# Patient Record
Sex: Female | Born: 2000 | Race: Black or African American | Hispanic: No | Marital: Single | Smoking: Never smoker
Health system: Southern US, Community
[De-identification: ages and names within clinical notes are randomized; demographics above are authoritative.]

## PROBLEM LIST (undated history)

## (undated) DIAGNOSIS — F319 Bipolar disorder, unspecified: Secondary | ICD-10-CM

## (undated) DIAGNOSIS — E282 Polycystic ovarian syndrome: Secondary | ICD-10-CM

## (undated) HISTORY — DX: Bipolar disorder, unspecified: F31.9

## (undated) HISTORY — DX: Polycystic ovarian syndrome: E28.2

---
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
## 2020-05-02 ENCOUNTER — Ambulatory Visit (HOSPITAL_COMMUNITY)
Admission: EM | Admit: 2020-05-02 | Discharge: 2020-05-02 | Disposition: A | Discharge status: Home / Self Care | Payer: Medicaid Other | Attending: Family Medicine | Admitting: Family Medicine

## 2020-05-02 ENCOUNTER — Encounter (HOSPITAL_COMMUNITY): Payer: Self-pay

## 2020-05-02 ENCOUNTER — Other Ambulatory Visit: Payer: Self-pay

## 2020-05-02 DIAGNOSIS — J029 Acute pharyngitis, unspecified: Secondary | ICD-10-CM | POA: Diagnosis not present

## 2020-05-02 DIAGNOSIS — R059 Cough, unspecified: Secondary | ICD-10-CM | POA: Diagnosis not present

## 2020-05-02 LAB — POCT RAPID STREP A, ED / UC: Streptococcus, Group A Screen (Direct): NEGATIVE

## 2020-05-02 MED ORDER — HYDROCODONE-HOMATROPINE 5-1.5 MG/5ML PO SYRP: 5.0000 mL | ORAL_SOLUTION | Freq: Four times a day (QID) | ORAL | 0 refills | Status: DC | PRN

## 2020-05-02 MED ORDER — PREDNISONE 20 MG PO TABS
40.0000 mg | ORAL_TABLET | Freq: Every day | ORAL | 0 refills | Status: DC
Start: 2020-05-02 — End: 2021-12-22

## 2020-05-02 NOTE — Discharge Instructions (Signed)
Be aware, your cough medication may cause drowsiness. Please do not drive, operate heavy machinery or make important decisions while on this medication, it can cloud your judgement.  
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
## 2020-05-03 ENCOUNTER — Ambulatory Visit: Payer: Self-pay

## 2020-05-04 LAB — CULTURE, GROUP A STREP (THRC)

## 2020-05-04 NOTE — ED Provider Notes (Signed)
[facility]   [id number] 05/02/20 Arrival Time: 1213  ASSESSMENT & PLAN:  1. Sore throat   2. Cough    Rapid strep negative. Culture sent.  Declines COVID testing.   Meds ordered this encounter  Medications  . predniSONE (DELTASONE) 20 MG tablet    Sig: Take 2 tablets (40 mg total) by mouth daily.    Dispense:  10 tablet    Refill:  0  . HYDROcodone-homatropine (HYCODAN) 5-1.5 MG/5ML syrup    Sig: Take 5 mLs by mouth every 6 (six) hours as needed for cough.    Dispense:  90 mL    Refill:  0     Follow-up Information    Jovita Kussmaul, MD.   Specialty: Family Medicine Why: If worsening or failing to improve as anticipated. Contact information: [address] [place] [address] [phone]               Reviewed expectations re: course of current medical issues. Questions answered. Outlined signs and symptoms indicating need for more acute intervention. Understanding verbalized. After Visit Summary given.   SUBJECTIVE: History from: patient and caregiver. Rose Savage is a 19 y.o. female who presents with a sore throat, mild congestion, and persistent cough. Over past week. Afebrile. Normal PO intake without n/v/d. No known COVID exp. Cough is keeping her up at night.    OBJECTIVE:  Vitals:   05/02/20 1259  BP: 118/65  Pulse: 79  Resp: 18  Temp: 98 F (36.7 C)  TempSrc: Oral  SpO2: 100%    General appearance: alert; no distress Eyes: PERRLA; EOMI; conjunctiva normal HENT: [place]; AT; with nasal congestion; throat with mild erythema and cobblestoning Neck: supple  Lungs: speaks full sentences without difficulty; unlabored Extremities: no edema Skin: warm and dry Neurologic: normal gait Psychological: alert and cooperative; normal mood and affect  Labs:  Labs Reviewed  CULTURE, GROUP A STREP [facility])  POCT RAPID STREP A, ED / UC      No Known Allergies  Past Medical History:  Diagnosis Date  . Bipolar disorder (HCC)    . PCOS (polycystic ovarian syndrome)    Social History   Socioeconomic History  . Marital status: Single    Spouse name: Not on file  . Number of children: Not on file  . Years of education: Not on file  . Highest education level: Not on file  Occupational History  . Not on file  Tobacco Use  . Smoking status: Never Smoker  . Smokeless tobacco: Never Used  Substance and Sexual Activity  . Alcohol use: No    Alcohol/week: 0.0 standard drinks  . Drug use: No  . Sexual activity: Never  Other Topics Concern  . Not on file  Social History Narrative   Lives at home with Mom, 4 sisters and 1 nephew. She is in 8th grade.    Social Determinants of Health   Financial Resource Strain:   . Difficulty of Paying Living Expenses: Not on file  Food Insecurity:   . Worried About Programme researcher, broadcasting/film/video in the Last Year: Not on file  . Ran Out of Food in the Last Year: Not on file  Transportation Needs:   . Lack of Transportation (Medical): Not on file  . Lack of Transportation (Non-Medical): Not on file  Physical Activity:   . Days of Exercise per Week: Not on file  . Minutes of Exercise per Session: Not on file  Stress:   . Feeling of Stress :  Not on file  Social Connections:   . Frequency of Communication with Friends and Family: Not on file  . Frequency of Social Gatherings with Friends and Family: Not on file  . Attends Religious Services: Not on file  . Active Member of Clubs or Organizations: Not on file  . Attends Banker Meetings: Not on file  . Marital Status: Not on file  Intimate Partner Violence:   . Fear of Current or Ex-Partner: Not on file  . Emotionally Abused: Not on file  . Physically Abused: Not on file  . Sexually Abused: Not on file   Family History  Adopted: Yes   History reviewed. No pertinent surgical history.   Mardella Layman, MD 05/04/20 1014

## 2020-05-10 ENCOUNTER — Other Ambulatory Visit: Payer: Self-pay | Admitting: Family

## 2020-05-10 DIAGNOSIS — E282 Polycystic ovarian syndrome: Secondary | ICD-10-CM

## 2020-05-13 ENCOUNTER — Encounter: Payer: Self-pay | Admitting: Family Medicine

## 2020-05-13 ENCOUNTER — Other Ambulatory Visit: Payer: Self-pay

## 2020-05-13 ENCOUNTER — Ambulatory Visit (INDEPENDENT_AMBULATORY_CARE_PROVIDER_SITE_OTHER): Payer: Medicaid Other | Admitting: Family Medicine

## 2020-05-13 VITALS — BP 104/64 | HR 91 | Ht 62.8 in | Wt 171.8 lb

## 2020-05-13 DIAGNOSIS — R251 Tremor, unspecified: Secondary | ICD-10-CM

## 2020-05-13 NOTE — Progress Notes (Signed)
    SUBJECTIVE:   CHIEF COMPLAINT / HPI:   Tremor Patient presents out of concern for bilateral hand tremor.  This has been a persistent thing and she has been evaluated for this in the past.  Patient reports that it did improve over the last few months but that she woke up this morning and noticed it was significantly worse.  She had some breakfast and the tremor has improved but she still has a tremor in her right thumb as well as she feels like her face.  Reports that she is taking her medications as prescribed and she was on a steroid dose for sinus infection which she completed last Friday or Saturday.  PERTINENT  PMH / PSH: History of tremor, ADHD, bipolar  OBJECTIVE:   BP 104/64   Pulse 91   Ht 5' 2.8" (1.595 m)   Wt 171 lb 12.8 oz (77.9 kg)   LMP 04/19/2020 (Approximate)   SpO2 99%   BMI 30.63 kg/m   General: Well-appearing 19 year old female in no acute distress Cardiac: Regular rate and rhythm, no murmurs appreciated Respiratory: Normal work of breathing Neuro: Slight tremor in right thumb, finger-to-nose tests appropriate, patient does intermittently have mild face shaking, reflexes appropriate and symmetrical, strength appropriate ASSESSMENT/PLAN:   Tremor Mild tremor in right thumb noted, neuro exam is reassuring.  Most likely an extrapyramidal side effect from her medications.  Also consider electrolyte abnormalities and hyperthyroidism.  She is scheduled to follow-up with her psychiatrist in a week or 2. -Checking Depakote levels today -BMP today -TSH ordered -Follow-up with psychiatry regarding medication management   Derrel Nip, MD [facility]

## 2020-05-13 NOTE — Patient Instructions (Addendum)
It was great to see you this morning.  I am sorry you are having these issues with the tremor.  It may be related to your medications but it may also be related to you stopping the prednisone.  Other possibilities are issues with your thyroid but this is much less likely.  We are going to check your thyroid levels as well as your medication levels.  I am also going to check your blood glucose.  If it turns out that it is related to your medications you will need to follow-up with your psychiatric provider for adjustments in this.  These results will be posted to your MyChart but I can also call you with them.  If you have any questions, concerns please feel free to call the clinic.  I hope you have a wonderful afternoon!   Tremor A tremor is trembling or shaking that you cannot control. Most tremors affect the hands or arms. Tremors can also affect the head, vocal cords, face, and other parts of the body. There are many types of tremors. Common types include:  Essential tremor. These usually occur in people older than 40. It may run in families and can happen in otherwise healthy people.  Resting tremor. These occur when the muscles are at rest, such as when your hands are resting in your lap. People with Parkinson's disease often have resting tremors.  Postural tremor. These occur when you try to hold a pose, such as keeping your hands outstretched.  Kinetic tremor. These occur during purposeful movement, such as trying to touch a finger to your nose.  Task-specific tremor. These may occur when you perform certain tasks such as writing, speaking, or standing.  Psychogenic tremor. These dramatically lessen or disappear when you are distracted. They can happen in people of all ages. Some types of tremors have no known cause. Tremors can also be a symptom of nervous system problems (neurological disorders) that may occur with aging. Some tremors go away with treatment, while others do not. Follow  these instructions at home: Lifestyle      Limit alcohol intake to no more than 1 drink a day for nonpregnant women and 2 drinks a day for men. One drink equals 12 oz of beer, 5 oz of wine, or 1 oz of hard liquor.  Do not use any products that contain nicotine or tobacco, such as cigarettes and e-cigarettes. If you need help quitting, ask your health care provider.  Avoid extreme heat and extreme cold.  Limit your caffeine intake, as told by your health care provider.  Try to get 8 hours of sleep each night.  Find ways to manage your stress, such as meditation or yoga. General instructions  Take over-the-counter and prescription medicines only as told by your health care provider.  Keep all follow-up visits as told by your health care provider. This is important. Contact a health care provider if you:  Develop a tremor after starting a new medicine.  Have a tremor along with other symptoms such as: ? Numbness. ? Tingling. ? Pain. ? Weakness.  Notice that your tremor gets worse.  Notice that your tremor interferes with your day-to-day life. Summary  A tremor is trembling or shaking that you cannot control.  Most tremors affect the hands or arms.  Some types of tremors have no known cause. Others may be a symptom of nervous system problems (neurological disorders).  Make sure you discuss any tremors you have with your health care provider. This  information is not intended to replace advice given to you by your health care provider. Make sure you discuss any questions you have with your health care provider. Document Revised: 05/10/2017 Document Reviewed: 03/28/2017 Elsevier Patient Education  2020 [employer].

## 2020-05-13 NOTE — Assessment & Plan Note (Addendum)
Mild tremor in right thumb noted, neuro exam is reassuring.  Most likely an extrapyramidal side effect from her medications.  Also consider electrolyte abnormalities and hyperthyroidism.  She is scheduled to follow-up with her psychiatrist in a week or 2. -Checking Depakote levels today -BMP today -TSH ordered -Follow-up with psychiatry regarding medication management

## 2020-05-14 LAB — BASIC METABOLIC PANEL
BUN/Creatinine Ratio: 12 (ref 9–23)
BUN: 8 mg/dL (ref 6–20)
CO2: 19 mmol/L — ABNORMAL LOW (ref 20–29)
Calcium: 9.4 mg/dL (ref 8.7–10.2)
Chloride: 106 mmol/L (ref 96–106)
Creatinine, Ser: 0.67 mg/dL (ref 0.57–1.00)
GFR calc Af Amer: 147 mL/min/{1.73_m2} (ref >59)
GFR calc non Af Amer: 128 mL/min/{1.73_m2} (ref >59)
Glucose: 135 mg/dL — ABNORMAL HIGH (ref 65–99)
Potassium: 4 mmol/L (ref 3.5–5.2)
Sodium: 141 mmol/L (ref 134–144)

## 2020-05-14 LAB — TSH: TSH: 0.79 u[IU]/mL (ref 0.450–4.500)

## 2020-05-14 LAB — VALPROIC ACID LEVEL: Valproic Acid Lvl: 44 ug/mL — ABNORMAL LOW (ref 50–100)

## 2020-05-24 ENCOUNTER — Encounter: Payer: Self-pay | Admitting: Family Medicine

## 2020-05-24 NOTE — Progress Notes (Signed)
I have attempted to call the patient on multiple occasions but the patient's voicemail will not let you leave a voicemail.  Her labs are mostly normal except her valproic acid level is a little low.  This may be due to noncompliance or she may need an increased dose.  She needs to be sure to take her medications daily.  I will mail a letter with her results.

## 2020-06-21 ENCOUNTER — Other Ambulatory Visit: Payer: Self-pay

## 2020-06-21 ENCOUNTER — Emergency Department (HOSPITAL_COMMUNITY)
Admission: EM | Admit: 2020-06-21 | Discharge: 2020-06-22 | Disposition: A | Discharge status: Home / Self Care | Payer: Medicaid Other | Attending: Emergency Medicine | Admitting: Emergency Medicine

## 2020-06-21 DIAGNOSIS — F3110 Bipolar disorder, current episode manic without psychotic features, unspecified: Secondary | ICD-10-CM | POA: Diagnosis not present

## 2020-06-21 DIAGNOSIS — T50905A Adverse effect of unspecified drugs, medicaments and biological substances, initial encounter: Secondary | ICD-10-CM

## 2020-06-21 DIAGNOSIS — Z20822 Contact with and (suspected) exposure to covid-19: Secondary | ICD-10-CM | POA: Diagnosis not present

## 2020-06-21 DIAGNOSIS — R251 Tremor, unspecified: Secondary | ICD-10-CM | POA: Diagnosis present

## 2020-06-21 DIAGNOSIS — F3160 Bipolar disorder, current episode mixed, unspecified: Secondary | ICD-10-CM | POA: Diagnosis not present

## 2020-06-21 DIAGNOSIS — F3161 Bipolar disorder, current episode mixed, mild: Secondary | ICD-10-CM | POA: Insufficient documentation

## 2020-06-21 LAB — CBC
HCT: 39.2 % (ref 36.0–46.0)
Hemoglobin: 13.5 g/dL (ref 12.0–15.0)
MCH: 30.7 pg (ref 26.0–34.0)
MCHC: 34.4 g/dL (ref 30.0–36.0)
MCV: 89.1 fL (ref 80.0–100.0)
Platelets: 203 10*3/uL (ref 150–400)
RBC: 4.4 MIL/uL (ref 3.87–5.11)
RDW: 11.9 % (ref 11.5–15.5)
WBC: 7.5 10*3/uL (ref 4.0–10.5)
nRBC: 0 % (ref 0.0–0.2)

## 2020-06-21 LAB — COMPREHENSIVE METABOLIC PANEL
ALT: 22 U/L (ref 0–44)
AST: 19 U/L (ref 15–41)
Albumin: 3.6 g/dL (ref 3.5–5.0)
Alkaline Phosphatase: 39 U/L (ref 38–126)
Anion gap: 12 (ref 5–15)
BUN: 8 mg/dL (ref 6–20)
CO2: 21 mmol/L — ABNORMAL LOW (ref 22–32)
Calcium: 9.6 mg/dL (ref 8.9–10.3)
Chloride: 106 mmol/L (ref 98–111)
Creatinine, Ser: 0.64 mg/dL (ref 0.44–1.00)
GFR, Estimated: >60 mL/min (ref >60)
Glucose, Bld: 83 mg/dL (ref 70–99)
Potassium: 3.9 mmol/L (ref 3.5–5.1)
Sodium: 139 mmol/L (ref 135–145)
Total Bilirubin: 0.2 mg/dL — ABNORMAL LOW (ref 0.3–1.2)
Total Protein: 7.1 g/dL (ref 6.5–8.1)

## 2020-06-21 LAB — RAPID URINE DRUG SCREEN, HOSP PERFORMED
Amphetamines: NOT DETECTED
Barbiturates: NOT DETECTED
Benzodiazepines: NOT DETECTED
Cocaine: NOT DETECTED
Opiates: NOT DETECTED
Tetrahydrocannabinol: NOT DETECTED

## 2020-06-21 LAB — RESP PANEL BY RT-PCR (FLU A&B, COVID) ARPGX2
Influenza A by PCR: NEGATIVE
Influenza B by PCR: NEGATIVE
SARS Coronavirus 2 by RT PCR: NEGATIVE

## 2020-06-21 LAB — I-STAT BETA HCG BLOOD, ED (MC, WL, AP ONLY): I-stat hCG, quantitative: <5 m[IU]/mL (ref <5)

## 2020-06-21 MED ORDER — LORAZEPAM 1 MG PO TABS
1.0000 mg | ORAL_TABLET | Freq: Once | ORAL | Status: AC
  Administered 2020-06-21: 1 mg via ORAL
  Filled 2020-06-21: qty 1

## 2020-06-21 NOTE — ED Notes (Signed)
Pt wanded °

## 2020-06-21 NOTE — ED Triage Notes (Signed)
Pt with hx of bipolar, arrives from home BIB mother. Mother reports pt manic, crying, and slamming doors recently. Called [facility] about bringing her there and they told her to come to the ED. Pt tearful in triage.

## 2020-06-21 NOTE — ED Provider Notes (Signed)
[facility] EMERGENCY DEPARTMENT Provider Note   CSN: [id number] Arrival date & time: 06/21/20  1643     History No chief complaint on file.   Rose Savage is a 20 y.o. female.  Patient is a 20 year old female with a history of bipolar disease and PCOS who is presenting today reporting that the medication she recently started 3 weeks ago is not working well.  She does not know why they changed her medication but since being on it she feels like she is getting much worse.  She is having mood swings and today reports she has been crying all day long.  She also admits to hearing voices and more recently they have told her she would be better with another family.  She states in the past they have never sounded this harsh before.  She has been taking her medication as prescribed.  She denies any drug alcohol or cigarette use.  She does feel like she has been sleeping okay but has been in multiple arguments with her mom.  Mom is concerned that the patient is manic as she has been crying and slamming doors frequently.  They did call behavioral health urgent care and they requested that they bring her to the emergency room.  She denies any suicidal or homicidal thoughts at this time.  The history is provided by the patient.       Past Medical History:  Diagnosis Date  . Bipolar disorder (HCC)   . PCOS (polycystic ovarian syndrome)     Patient Active Problem List   Diagnosis Date Noted  . Tremor 01/27/2019  . PCOS (polycystic ovarian syndrome) 08/28/2014  . Abnormal weight gain 08/28/2014  . Acne vulgaris 08/28/2014    No past surgical history on file.   OB History   No obstetric history on file.     Family History  Adopted: Yes    Social History   Tobacco Use  . Smoking status: Never Smoker  . Smokeless tobacco: Never Used  Substance Use Topics  . Alcohol use: No    Alcohol/week: 0.0 standard drinks  . Drug use: No    Home Medications Prior to  Admission medications   Medication Sig Start Date End Date Taking? Authorizing Provider  adapalene (DIFFERIN) 0.1 % gel APPLY TOPICALLY AT BEDTIME AND WASH OFF IN THE MORNING 05/22/16   Georges Mouse, NP  [facility] MELATONIN 3 MG TABS tablet TAKE 1 TABLET (3 MG TOTAL) BY MOUTH AT BEDTIME AS NEEDED. 12/22/19   Georges Mouse, NP  divalproex (DEPAKOTE ER) 500 MG 24 hr tablet Take 500 mg by mouth 2 (two) times daily. 08/07/15   [provider]  FANAPT 2 MG TABS Take by mouth at bedtime. 03/24/20   [provider]  HYDROcodone-homatropine (HYCODAN) 5-1.5 MG/5ML syrup Take 5 mLs by mouth every 6 (six) hours as needed for cough. 05/02/20   Mardella Layman, MD  norgestimate-ethinyl estradiol (ORTHO-CYCLEN) 0.25-35 MG-MCG tablet TAKE 1 TABLET BY MOUTH EVERY DAY 05/10/20   Alfonso Ramus T, FNP  predniSONE (DELTASONE) 20 MG tablet Take 2 tablets (40 mg total) by mouth daily. 05/02/20   Mardella Layman, MD  spironolactone (ALDACTONE) 100 MG tablet TAKE 1 TABLET BY MOUTH EVERY DAY 12/21/19   Georges Mouse, NP  SPRINTEC 28 0.25-35 MG-MCG tablet TAKE 1 TABLET BY MOUTH EVERY DAY 10/08/17   Georges Mouse, NP  STRATTERA 80 MG capsule Take 80 mg by mouth daily. 06/19/15   [provider]  Allergies    Patient has no known allergies.  Review of Systems   Review of Systems  All other systems reviewed and are negative.   Physical Exam Updated Vital Signs BP 117/79 (BP Location: Right Arm)   Pulse 79   Temp 98.7 F (37.1 C) (Oral)   Resp 18   Ht 5\' 2"  (1.575 m)   Wt 78.5 kg   SpO2 100%   BMI 31.64 kg/m   Physical Exam Vitals and nursing note reviewed.  Constitutional:      General: She is not in acute distress.    Appearance: She is well-developed, normal weight and well-nourished.  HENT:     Head: Normocephalic and atraumatic.  Eyes:     Extraocular Movements: EOM normal.     Pupils: Pupils are equal, round, and reactive to light.  Cardiovascular:     Rate and  Rhythm: Normal rate and regular rhythm.     Pulses: Intact distal pulses.     Heart sounds: Normal heart sounds. No murmur heard. No friction rub.  Pulmonary:     Effort: Pulmonary effort is normal.     Breath sounds: Normal breath sounds. No wheezing or rales.  Abdominal:     General: Bowel sounds are normal. There is no distension.     Palpations: Abdomen is soft.     Tenderness: There is no abdominal tenderness. There is no guarding or rebound.  Musculoskeletal:        General: No tenderness. Normal range of motion.     Comments: No edema  Skin:    General: Skin is warm and dry.     Findings: No rash.  Neurological:     Mental Status: She is alert and oriented to person, place, and time.     Cranial Nerves: No cranial nerve deficit.  Psychiatric:        Attention and Perception: She perceives auditory hallucinations.        Mood and Affect: Mood is anxious. Affect is tearful.        Speech: Speech normal.        Behavior: Behavior is cooperative.        Thought Content: Thought content is not paranoid. Thought content does not include homicidal or suicidal ideation.     Comments: Pt holding a toy unicorn and frequently petting it and talking to it during the interview     ED Results / Procedures / Treatments   Labs (all labs ordered are listed, but only abnormal results are displayed) Labs Reviewed  COMPREHENSIVE METABOLIC PANEL - Abnormal; Notable for the following components:      Result Value   CO2 21 (*)    Total Bilirubin 0.2 (*)    All other components within normal limits  RESP PANEL BY RT-PCR (FLU A&B, COVID) ARPGX2  CBC  RAPID URINE DRUG SCREEN, HOSP PERFORMED  ETHANOL  SALICYLATE LEVEL  ACETAMINOPHEN LEVEL  I-STAT BETA HCG BLOOD, ED ([facility], [facility], [facility] ONLY)    EKG None  Radiology No results found.  Procedures Procedures (including critical care time)  Medications Ordered in ED Medications  LORazepam (ATIVAN) tablet 1 mg (has no administration in time  range)    ED Course  I have reviewed the triage vital signs and the nursing notes.  Pertinent labs & imaging results that were available during my care of the patient were reviewed by me and considered in my medical decision making (see chart for details).    MDM Rules/Calculators/A&P  Patient presenting today for mental health evaluation.  Concern for poor reaction to recent medication changes and possible manic episode.  Patient is tearful on exam and appears anxious but is cooperative.  She denies any SI or HI.  She appears medically clear at this time.  Vital signs are normal on exam other than her mental health is normal.  Patient is medically clear for evaluation.  COVID lab is pending.  hCG, CBC, CMP and urine drug screen are all negative.  Final Clinical Impression(s) / ED Diagnoses Final diagnoses:  None    Rx / DC Orders ED Discharge Orders    None       Gwyneth Sprout, MD 06/25/20 [phone]

## 2020-06-21 NOTE — BH Assessment (Signed)
Comprehensive Clinical Assessment (CCA) Note  06/21/2020 Rose Savage [id number]  Chief Complaint:  Chief Complaint  Patient presents with  . Medication Reaction   Visit Diagnosis:   Bipolar disorder, mixed  Rose Savage is a 20 yo female currently at [facility] for assessment of medication-induced symptoms related to anxiety/bipolar disorder. Pt reports that she feels her thoughts are racing, she is hearing voices (telling her negative things about herself, to hurt herself, and that she needs a new family), and increased levels of anxiety.  Pt denies SI, HI, and visual hallucinations. Pt denies any substance use at all. Pt is not expressing any paranoid ideation an is not responding to internal stimuli at time of assessment. Pt reports that her physicians at [facility] are managing current medications for her. Pt cannot remember the name of the medications other than abilify "I get an abilify shot". Pt states that her mom isn't happy with the way her medications are handled currently, and wants to make some changes. Pt reports that she does not feel she is a danger to herself at time of assessment.  Rose Savage, MSW, LCSW Outpatient Therapist/Triage Specialist   Disposition: Per Rose Abts, PA pt is to be transferred to [facility] for overnight observation and safety and will be reassessed by psychiatric provider in the AM.   CCA Screening, Triage and Referral (STR)  Patient Reported Information How did you hear about [place]? Self  Referral name: [facility] recommendation  Referral phone number: No data recorded  Whom do you see for routine medical problems? No data recorded Practice/Facility Name: No data recorded Practice/Facility Phone Number: No data recorded Name of Contact: No data recorded Contact Number: No data recorded Contact Fax Number: No data recorded Prescriber Name: No data recorded Prescriber Address (if known): No data recorded  What Is the Reason for Your  Visit/Call Today? No data recorded How Long Has This Been Causing You Problems? <Week  What Do You Feel Would Help You the Most Today? No data recorded  Have You Recently Been in Any Inpatient Treatment (Hospital/Detox/Crisis Center/28-Day Program)? No  Name/Location of Program/Hospital:No data recorded How Long Were You There? No data recorded When Were You Discharged? No data recorded  Have You Ever Received Services From [facility] Before? Yes  Who Do You See at [facility]? No data recorded  Have You Recently Had Any Thoughts About Hurting Yourself? No  Are You Planning to Commit Suicide/Harm Yourself At This time? No   Have you Recently Had Thoughts About Hurting Someone Rose Savage? No  Explanation: No data recorded  Have You Used Any Alcohol or Drugs in the Past 24 Hours? No  How Long Ago Did You Use Drugs or Alcohol? No data recorded What Did You Use and How Much? No data recorded  Do You Currently Have a Therapist/Psychiatrist? Yes  Name of Therapist/Psychiatrist: [facility]   Have You Been Recently Discharged From Any Office Practice or Programs? No  Explanation of Discharge From Practice/Program: No data recorded    CCA Screening Triage Referral Assessment Type of Contact: Tele-Assessment  Is this Initial or Reassessment? Initial Assessment  Date Telepsych consult ordered in [facility]:  No data recorded Time Telepsych consult ordered in [facility]:  No data recorded  Patient Reported Information Reviewed? Yes  Patient Left Without Being Seen? No data recorded Reason for Not Completing Assessment: No data recorded  Collateral Involvement: No data recorded  Does Patient Have a Court Appointed Legal Guardian? No data recorded Name and Contact  of Legal Guardian: No data recorded If Minor and Not Living with Parent(s), Who has Custody? No data recorded Is CPS involved or ever been involved? Never  Is APS involved or ever been involved? Never   Patient  Determined To Be At Risk for Harm To Self or Others Based on Review of Patient Reported Information or Presenting Complaint? No  Method: No data recorded Availability of Means: No data recorded Intent: No data recorded Notification Required: No data recorded Additional Information for Danger to Others Potential: No data recorded Additional Comments for Danger to Others Potential: No data recorded Are There Guns or Other Weapons in Your Home? No data recorded Types of Guns/Weapons: No data recorded Are These Weapons Safely Secured?                            No data recorded Who Could Verify You Are Able To Have These Secured: No data recorded Do You Have any Outstanding Charges, Pending Court Dates, Parole/Probation? No data recorded Contacted To Inform of Risk of Harm To Self or Others: No data recorded  Location of Assessment: [facility] ED   Does Patient Present under Involuntary Commitment? No  IVC Papers Initial File Date: No data recorded  [place] of Residence: [place]   Patient Currently Receiving the Following Services: Medication Management   Determination of Need: No data recorded  Options For Referral: [facility] (overnight observation)     CCA Biopsychosocial Intake/Chief Complaint:  No data recorded Current Symptoms/Problems: issues with medication right now--feels like she is having bad anxiety   Patient Reported Schizophrenia/Schizoaffective Diagnosis in Past: No   Strengths: graduated high school. proud of getting a job  Preferences: loves nieces and nephew  Abilities: No data recorded  Type of Services Patient Feels are Needed: medication management   Initial Clinical Notes/Concerns: anxiety levels are high   Mental Health Symptoms Depression:  Irritability; Fatigue; Difficulty Concentrating; Change in energy/activity; Sleep (too much or little)   Duration of Depressive symptoms: Greater than two weeks   Mania:  Racing thoughts (thoughts  telling me better off dead)   Anxiety:   Difficulty concentrating; Fatigue; Irritability; Restlessness   Psychosis:  Hallucinations (voices in head telling me things bad about myself and my family)   Duration of Psychotic symptoms: Less than six months   Trauma:  N/A   Obsessions:  None   Compulsions:  None   Inattention:  None   Hyperactivity/Impulsivity:  N/A   Oppositional/Defiant Behaviors:  N/A   Emotional Irregularity:  None   Other Mood/Personality Symptoms:  No data recorded   Mental Status Exam Appearance and self-care  Stature:  Average   Weight:  Average weight   Clothing:  No data recorded  Grooming:  Normal   Cosmetic use:  None   Posture/gait:  Normal   Motor activity:  Not Remarkable   Sensorium  Attention:  Normal   Concentration:  Normal   Orientation:  X5   Recall/memory:  Normal   Affect and Mood  Affect:  Appropriate   Mood:  Anxious   Relating  Eye contact:  Normal; Staring   Facial expression:  Anxious   Attitude toward examiner:  Cooperative   Thought and Language  Speech flow: Slow   Thought content:  Appropriate to Mood and Circumstances   Preoccupation:  None   Hallucinations:  Auditory (voices say negative things about pt and pts family. Sometimes tell her to hurt herself)  Organization:  No data recorded  Affiliated Computer ServicesExecutive Functions  Fund of Knowledge:  Good   Intelligence:  Average   Abstraction:  Functional   Judgement:  Fair   Reality Testing:  Adequate   Insight:  Fair   Decision Making:  Impulsive   Social Functioning  Social Maturity:  Impulsive   Social Judgement:  Naive   Stress  Stressors:  Grief/losses; Illness   Coping Ability:  Overwhelmed   Skill Deficits:  None   Supports:  Family     Religion: Religion/Spirituality Are You A Religious Person?: Yes  Leisure/Recreation: Leisure / Recreation Do You Have Hobbies?: Yes Leisure and Hobbies: beanie baby collector--loves to watch  wrestling. Love NFL football: like Runner, broadcasting/film/videoagles. Color, sing, draw, dance  Exercise/Diet: Exercise/Diet Do You Exercise?: Yes What Type of Exercise Do You Do?: Run/Walk How Many Times a Week Do You Exercise?: 1-3 times a week Do You Follow a Special Diet?: No (trying to eat healthy: limit junk food) Do You Have Any Trouble Sleeping?: Yes Explanation of Sleeping Difficulties: good nights and bad nights.  stay up all night one time.   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  Pt and mother very close. Description of patient's relationship with caregiver when they were a child: stable Patient's description of current relationship with people who raised him/her: stable Does patient have siblings?: Yes Description of patient's current relationship with siblings: one adopted brother--stable relationship with brother.  6 adopted sisters including myself. Stable relationships with sisters. Did patient suffer any verbal/emotional/physical/sexual abuse as a child?: No Did patient suffer from severe childhood neglect?: No Has patient ever been sexually abused/assaulted/raped as an adolescent or adult?: No Was the patient ever a victim of a crime or a disaster?: No Witnessed domestic violence?: No Has patient been affected by domestic violence as an adult?: No  Child/Adolescent Assessment:     CCA Substance Use Alcohol/Drug Use: Alcohol  / Drug Use Pain Medications: see MAR Prescriptions: see MAR Over the Counter: see MAR History of alcohol / drug use?: No history of alcohol / drug abuse          ASAM's:  Six Dimensions of Multidimensional Assessment  Dimension 1:  Acute Intoxication and/or Withdrawal Potential:   Dimension 1:  Description of individual's past and current experiences of substance use and withdrawal: pt denies  Dimension 2:  Biomedical Conditions and Complications:      Dimension 3:  Emotional, Behavioral, or Cognitive Conditions and Complications:     Dimension 4:  Readiness to Change:     Dimension 5:  Relapse, Continued use, or Continued Problem Potential:     Dimension 6:  Recovery/Living Environment:     ASAM Severity Score: ASAM's Severity Rating Score: 0  ASAM Recommended Level of Treatment:     Substance use Disorder (SUD)    Recommendations for Services/Supports/Treatments: Recommendations for Services/Supports/Treatments Recommendations For Services/Supports/Treatments: Medication Management (overnight observation [facility] and provider reassessment in AM)  DSM5 Diagnoses: Patient Active Problem List   Diagnosis Date Noted  . Bipolar disorder,  current episode mixed, mild (HCC)   . Tremor 01/27/2019  . PCOS (polycystic ovarian syndrome) 08/28/2014  . Abnormal weight gain 08/28/2014  . Acne vulgaris 08/28/2014    Referrals to Alternative Service(s): Referred to Alternative Service(s):   Place:   Date:   Time:    Referred to Alternative Service(s):   Place:   Date:   Time:    Referred to Alternative Service(s):   Place:   Date:   Time:    Referred to Alternative Service(s):   Place:   Date:   Time:     Ernest Haber Meredyth Hornung, LCSW

## 2020-06-21 NOTE — ED Notes (Signed)
Personal belongings inventoried and stored at locker[address]at [facility].

## 2020-06-21 NOTE — ED Notes (Signed)
TTS video interview in progress .  

## 2020-06-21 NOTE — BH Assessment (Signed)
Disposition: Per Melbourne Abts, PA pt is to be transferred to [facility] for overnight observation and safety and will be reassessed by psychiatric provider in the AM.  Weyman Pedro, MSW, LCSW Outpatient Therapist/Triage Specialist

## 2020-06-22 DIAGNOSIS — T50905A Adverse effect of unspecified drugs, medicaments and biological substances, initial encounter: Secondary | ICD-10-CM

## 2020-06-22 DIAGNOSIS — F3110 Bipolar disorder, current episode manic without psychotic features, unspecified: Secondary | ICD-10-CM

## 2020-06-22 DIAGNOSIS — R251 Tremor, unspecified: Secondary | ICD-10-CM

## 2020-06-22 NOTE — Consult Note (Signed)
Telepsych Consultation   Location of Patient: [facility] Location of Provider: [facility]  Patient Identification: Rose Savage MRN:  [id number] Principal Diagnosis: Tremor Diagnosis:  Principal Problem:   Tremor   Total Time spent with patient: 30 minutes  HPI:  Reassessment: Patient seen via telepsych. Chart reviewed. Rose Savage is a 20 year old female with history of bipolar disorder who was brought to [facility] by her mother yesterday, who reported patient had been manic, crying, and slamming doors at home.  On assessment today, patient is calm and cooperative, reporting stable mood. She states "It all started with a new medicine." She states after starting a new medication for tremors 2-3 weeks ago, she started having shakes, feeling dizzy at work, and "not like myself." She is unsure of the medication name but states she takes it at night and then has a difficult time standing out of bed in the morning because she is dizzy. When asked if the medication is Ingrezza, she states "I think that might be it." She reports crying yesterday on and off, but is unable to say why. She reports good mood prior to yesterday. She denies any SI/HI. She reports hearing voices yesterday telling her, "Go back to [place]. No one here loves you." She states this was the first time she heard voices in years. She denies any CAH to hurt herself or others in years. Denies any AVH today and shows no signs of responding to internal stimuli. No paranoid or delusional thought content expressed. She is requesting discharge home to her family so that she can return to work tomorrow.  With patient's expressed consent, collateral information from her mother Rose Savage [phone]: Rose Savage also reports patient has been "shaking like she has parkinson's," dizzy, drooling, speech changes, and fatigued since starting a new medication two weeks ago. She is unsure of the name of the medication but states  that it had to come from a specialty pharmacy. She tried calling the outpatient office but states they did not seem concerned about the side effects. I advised that the medication sounded like Ingrezza and was likely causing parkinsonian side effects. I advised patient discontinue Ingrezza and continue the remainder of her medication regimen which she has been stable on for years, and Rose Savage states understanding. I advised patient is requesting d/c home and does not meet IVC criteria at this time due to not being suicidal, homicidal or psychotic, and Rose Savage expresses agreement. She is requesting referrals for other outpatient psychiatric providers.  Per TTS assessment 06/21/20: Rose Savage is a 20 yo female currently at [facility] for assessment of medication-induced symptoms related to anxiety/bipolar disorder. Pt reports that she feels her thoughts are racing, she is hearing voices (telling her negative things about herself, to hurt herself, and that she needs a new family), and increased levels of anxiety.  Pt denies SI, HI, and visual hallucinations. Pt denies any substance use at all. Pt is not expressing any paranoid ideation an is not responding to internal stimuli at time of assessment. Pt reports that her physicians at [facility] are managing current medications for her. Pt cannot remember the name of the medications other than abilify "I get an abilify shot". Pt states that her mom isn't happy with the way her medications are handled currently, and wants to make some changes. Pt reports that she does not feel she is a danger to herself at time of assessment.  Disposition: Patient shows no evidence of acute risk of harm  to self or others and is psych cleared for discharge. CSW to provide outpatient referrals. ED staff updated.  Past Psychiatric History: See above  Risk to Self:   Risk to Others:   Prior Inpatient Therapy:   Prior Outpatient Therapy:    Past Medical History:  Past  Medical History:  Diagnosis Date  . Bipolar disorder (HCC)   . PCOS (polycystic ovarian syndrome)    No past surgical history on file. Family History:  Family History  Adopted: Yes   Family Psychiatric  History: Unknown Social History:  Social History   Substance and Sexual Activity  Alcohol Use No  . Alcohol/week: 0.0 standard drinks     Social History   Substance and Sexual Activity  Drug Use No    Social History   Socioeconomic History  . Marital status: Single    Spouse name: Not on file  . Number of children: Not on file  . Years of education: Not on file  . Highest education level: Not on file  Occupational History  . Not on file  Tobacco Use  . Smoking status: Never Smoker  . Smokeless tobacco: Never Used  Substance and Sexual Activity  . Alcohol use: No    Alcohol/week: 0.0 standard drinks  . Drug use: No  . Sexual activity: Never  Other Topics Concern  . Not on file  Social History Narrative   Lives at home with Mom, 4 sisters and 1 nephew. She is in 8th grade.    Social Determinants of Health   Financial Resource Strain: Not on file  Food Insecurity: Not on file  Transportation Needs: Not on file  Physical Activity: Not on file  Stress: Not on file  Social Connections: Not on file   Additional Social History:    Allergies:  No Known Allergies  Labs:  Results for orders placed or performed during the hospital encounter of 06/21/20 (from the past 48 hour(s))  Comprehensive metabolic panel     Status: Abnormal   Collection Time: 06/21/20  4:54 PM  Result Value Ref Range   Sodium 139 135 - 145 mmol/L   Potassium 3.9 3.5 - 5.1 mmol/L   Chloride 106 98 - 111 mmol/L   CO2 21 (L) 22 - 32 mmol/L   Glucose, Bld 83 70 - 99 mg/dL    Comment: Glucose reference range applies only to samples taken after fasting for at least 8 hours.   BUN 8 6 - 20 mg/dL   Creatinine, Ser 0.93 0.44 - 1.00 mg/dL   Calcium 9.6 8.9 - 81.8 mg/dL   Total Protein 7.1 6.5 -  8.1 g/dL   Albumin 3.6 3.5 - 5.0 g/dL   AST 19 15 - 41 U/L   ALT 22 0 - 44 U/L   Alkaline Phosphatase 39 38 - 126 U/L   Total Bilirubin 0.2 (L) 0.3 - 1.2 mg/dL   GFR, Estimated >29 >93 mL/min    Comment: (NOTE) Calculated using the CKD-EPI Creatinine Equation (2021)    Anion gap 12 5 - 15    Comment: Performed at [facility], [address] [place], [place] [address]  cbc     Status: None   Collection Time: 06/21/20  4:54 PM  Result Value Ref Range   WBC 7.5 4.0 - 10.5 K/uL   RBC 4.40 3.87 - 5.11 MIL/uL   Hemoglobin 13.5 12.0 - 15.0 g/dL   HCT 78.9 38.1 - 01.7 %   MCV 89.1 80.0 - 100.0  fL   MCH 30.7 26.0 - 34.0 pg   MCHC 34.4 30.0 - 36.0 g/dL   RDW 40.911.9 81.111.5 - 91.415.5 %   Platelets 203 150 - 400 K/uL   nRBC 0.0 0.0 - 0.2 %    Comment: Performed at [facility], [address] [place], [place] [address]  I-Stat beta hCG blood, ED     Status: None   Collection Time: 06/21/20  5:10 PM  Result Value Ref Range   I-stat hCG, quantitative <5.0 <5 mIU/mL   Comment 3            Comment:   GEST. AGE      CONC.  (mIU/mL)   <=1 WEEK        5 - 50     2 WEEKS       50 - 500     3 WEEKS       100 - 10,000     4 WEEKS     1,000 - 30,000        FEMALE AND NON-PREGNANT FEMALE:     LESS THAN 5 mIU/mL   Rapid urine drug screen (hospital performed)     Status: None   Collection Time: 06/21/20  7:55 PM  Result Value Ref Range   Opiates NONE DETECTED NONE DETECTED   Cocaine NONE DETECTED NONE DETECTED   Benzodiazepines NONE DETECTED NONE DETECTED   Amphetamines NONE DETECTED NONE DETECTED   Tetrahydrocannabinol NONE DETECTED NONE DETECTED   Barbiturates NONE DETECTED NONE DETECTED    Comment: (NOTE) DRUG SCREEN FOR MEDICAL PURPOSES ONLY.  IF CONFIRMATION IS NEEDED FOR ANY PURPOSE, NOTIFY LAB WITHIN 5 DAYS.  LOWEST DETECTABLE LIMITS FOR URINE DRUG SCREEN Drug Class                     Cutoff (ng/mL) Amphetamine and metabolites    1000 Barbiturate and metabolites     200 Benzodiazepine                 200 Tricyclics and metabolites     300 Opiates and metabolites        300 Cocaine and metabolites        300 THC                            50 Performed at [facility], [address] [place], [place] [address]   Resp Panel by RT-PCR (Flu A&B, Covid) Nasopharyngeal Swab     Status: None   Collection Time: 06/21/20  9:30 PM   Specimen: Nasopharyngeal Swab; Nasopharyngeal(NP) swabs in vial transport medium  Result Value Ref Range   SARS Coronavirus 2 by RT PCR NEGATIVE NEGATIVE    Comment: (NOTE) SARS-CoV-2 target nucleic acids are NOT DETECTED.  The SARS-CoV-2 RNA is generally detectable in upper respiratory specimens during the acute phase of infection. The lowest concentration of SARS-CoV-2 viral copies this assay can detect is 138 copies/mL. A negative result does not preclude SARS-Cov-2 infection and should not be used as the sole basis for treatment or other patient management decisions. A negative result may occur with  improper specimen collection/handling, submission of specimen other than nasopharyngeal swab, presence of viral mutation(s) within the areas targeted by this assay, and inadequate number of viral copies(<138 copies/mL). A negative result must be combined with clinical observations, patient history, and epidemiological information. The expected result is Negative.  Fact Sheet  for Patients:  BloggerCourse.com  Fact Sheet for Healthcare Providers:  SeriousBroker.it  This test is no t yet approved or cleared by the [place] FDA and  has been authorized for detection and/or diagnosis of SARS-CoV-2 by FDA under an Emergency Use Authorization (EUA). This EUA will remain  in effect (meaning this test can be used) for the duration of the COVID-19 declaration under Section 564(b)(1) of the Act, 21 U.S.C.section 360bbb-3(b)(1), unless the authorization is terminated  or  revoked sooner.       Influenza A by PCR NEGATIVE NEGATIVE   Influenza B by PCR NEGATIVE NEGATIVE    Comment: (NOTE) The Xpert Xpress SARS-CoV-2/FLU/RSV plus assay is intended as an aid in the diagnosis of influenza from Nasopharyngeal swab specimens and should not be used as a sole basis for treatment. Nasal washings and aspirates are unacceptable for Xpert Xpress SARS-CoV-2/FLU/RSV testing.  Fact Sheet for Patients: BloggerCourse.com  Fact Sheet for Healthcare Providers: SeriousBroker.it  This test is not yet approved or cleared by the [place] FDA and has been authorized for detection and/or diagnosis of SARS-CoV-2 by FDA under an Emergency Use Authorization (EUA). This EUA will remain in effect (meaning this test can be used) for the duration of the COVID-19 declaration under Section 564(b)(1) of the Act, 21 U.S.C. section 360bbb-3(b)(1), unless the authorization is terminated or revoked.  Performed at [facility], [address] [place], [place] [address]     Medications:  No current facility-administered medications for this encounter.   Current Outpatient Medications  Medication Sig Dispense Refill  . adapalene (DIFFERIN) 0.1 % gel APPLY TOPICALLY AT BEDTIME AND WASH OFF IN THE MORNING 45 g 6  . [facility] MELATONIN 3 MG TABS tablet TAKE 1 TABLET (3 MG TOTAL) BY MOUTH AT BEDTIME AS NEEDED. 60 tablet 5  . divalproex (DEPAKOTE ER) 500 MG 24 hr tablet Take 500 mg by mouth 2 (two) times daily.  2  . FANAPT 2 MG TABS Take by mouth at bedtime.    Marland Kitchen HYDROcodone-homatropine (HYCODAN) 5-1.5 MG/5ML syrup Take 5 mLs by mouth every 6 (six) hours as needed for cough. 90 mL 0  . norgestimate-ethinyl estradiol (ORTHO-CYCLEN) 0.25-35 MG-MCG tablet TAKE 1 TABLET BY MOUTH EVERY DAY 84 tablet 1  . predniSONE (DELTASONE) 20 MG tablet Take 2 tablets (40 mg total) by mouth daily. 10 tablet 0  . spironolactone (ALDACTONE) 100 MG  tablet TAKE 1 TABLET BY MOUTH EVERY DAY 90 tablet 1  . SPRINTEC 28 0.25-35 MG-MCG tablet TAKE 1 TABLET BY MOUTH EVERY DAY 28 tablet 5  . STRATTERA 80 MG capsule Take 80 mg by mouth daily.  3   Psychiatric Specialty Exam: Physical Exam  Review of Systems  Blood pressure 106/68, pulse 72, temperature (!) 97.4 F (36.3 C), temperature source Oral, resp. rate 18, height 5\' 2"  (1.575 m), weight 78.5 kg, SpO2 99 %.Body mass index is 31.64 kg/m.  General Appearance: Casual  Eye Contact:  Good  Speech:  Normal Rate  Volume:  Normal  Mood:  Euthymic  Affect:  Appropriate and Congruent  Thought Process:  Coherent and Goal Directed  Orientation:  Full (Time, Place, and Person)  Thought Content:  Logical  Suicidal Thoughts:  No  Homicidal Thoughts:  No  Memory:  Immediate;   Fair Recent;   Fair Remote;   Fair  Judgement:  Intact  Insight:  Fair  Psychomotor Activity:  Normal  Concentration:  Concentration: Fair and Attention Span: Fair  Recall:  Fair  Progress EnergyFund of Knowledge:  Fair  Language:  Good  Akathisia:  No  Handed:  Right  AIMS (if indicated):     Assets:  Communication Skills Desire for Improvement Financial Resources/Insurance Housing Resilience Social Support  ADL's:  Intact  Cognition:  WNL  Sleep:       Disposition: Patient shows no evidence of acute risk of harm to self or others and is psych cleared for discharge. CSW to provide outpatient referrals. ED staff updated.  This service was provided via telemedicine using a 2-way, interactive audio and video technology with the identified patient and this Clinical research associatewriter.  Aldean BakerJanet E Sriyan Cutting, NP 06/22/2020 11:40 AM

## 2020-06-22 NOTE — ED Notes (Signed)
In triage Breakfast ordered 

## 2020-06-22 NOTE — Discharge Instructions (Signed)
Please follow up with on of the following outpatient mental health providers:  [facility] (counseling)  [phone] [address],  [place], [place] [address]  [facility] (counseling)  [phone] [address] [place], [place] [address]  [place]  [phone]  [address],  [place], [place] [address]  [facility]  (Outpatient therapy, Intensive [place], [place]) [address] [place], [place] [address] [phone] [address] [place], [place] [address] [phone]  [facility] (Medication Management)  [phone] [address]  [place], [place] [address]  You might also want to look at- Psychologytoday.[email or web address] to search for specialized outpatient therapists that take Medicaid.  In case of crisis: please bring patient back to [facility] Emergency Department, call 911 or [facility] at [phone].

## 2020-06-22 NOTE — ED Notes (Signed)
TTS speaking with pt.  

## 2020-06-22 NOTE — Progress Notes (Signed)
Pt has been psychiatrically cleared by Marciano Sequin, NP. Outpatient mental health contact information has been placed in pt's AVS.   Wells Guiles, MSW, LCSW, LCAS Clinical Social Worker II Disposition CSW [phone]

## 2020-06-22 NOTE — ED Notes (Signed)
Pt sleeping.  Breakfast placed on bedside table.

## 2020-06-22 NOTE — ED Provider Notes (Signed)
Emergency Medicine Observation Re-evaluation Note  Rhandi Despain is a 20 y.o. female, seen on rounds today.  Pt initially presented to the ED for complaints of Medication Reaction Currently, the patient is calm and cooperative.  Physical Exam  BP 106/68 (BP Location: Right Arm)   Pulse 72   Temp (!) 97.4 F (36.3 C) (Oral)   Resp 18   Ht 5\' 2"  (1.575 m)   Wt 78.5 kg   SpO2 99%   BMI 31.64 kg/m  Physical Exam General: Comfortable Cardiac: Normal heart rate Lungs: No respiratory distress Psych: Not responding to internal stimuli  ED Course / MDM  EKG:    I have reviewed the labs performed to date as well as medications administered while in observation.  Recent changes in the last 24 hours include stable, without ongoing discomfort.  Plan  Current plan is for discharge home.  Stop Ingrezza, which may be causing some of her symptoms. Patient is not under full IVC at this time.   , MD 06/22/20 1431

## 2020-06-24 ENCOUNTER — Ambulatory Visit: Payer: Medicaid Other | Admitting: Family Medicine

## 2020-07-09 ENCOUNTER — Other Ambulatory Visit: Payer: Self-pay

## 2020-07-09 ENCOUNTER — Ambulatory Visit (HOSPITAL_COMMUNITY)
Admission: EM | Admit: 2020-07-09 | Discharge: 2020-07-09 | Disposition: A | Discharge status: Home / Self Care | Payer: Medicaid Other | Attending: Family Medicine | Admitting: Family Medicine

## 2020-07-09 ENCOUNTER — Encounter (HOSPITAL_COMMUNITY): Payer: Self-pay | Admitting: Emergency Medicine

## 2020-07-09 DIAGNOSIS — J101 Influenza due to other identified influenza virus with other respiratory manifestations: Secondary | ICD-10-CM | POA: Diagnosis not present

## 2020-07-09 DIAGNOSIS — R509 Fever, unspecified: Secondary | ICD-10-CM | POA: Insufficient documentation

## 2020-07-09 DIAGNOSIS — R059 Cough, unspecified: Secondary | ICD-10-CM | POA: Diagnosis not present

## 2020-07-09 DIAGNOSIS — J029 Acute pharyngitis, unspecified: Secondary | ICD-10-CM | POA: Diagnosis present

## 2020-07-09 DIAGNOSIS — Z20822 Contact with and (suspected) exposure to covid-19: Secondary | ICD-10-CM | POA: Diagnosis not present

## 2020-07-09 LAB — SARS CORONAVIRUS 2 (TAT 6-24 HRS): SARS Coronavirus 2: NEGATIVE

## 2020-07-09 MED ORDER — HYDROCODONE-HOMATROPINE 5-1.5 MG/5ML PO SYRP: 5.0000 mL | ORAL_SOLUTION | Freq: Four times a day (QID) | ORAL | 0 refills | Status: DC | PRN

## 2020-07-09 MED ORDER — OSELTAMIVIR PHOSPHATE 75 MG PO CAPS: 75.0000 mg | ORAL_CAPSULE | Freq: Two times a day (BID) | ORAL | 0 refills | Status: AC

## 2020-07-09 MED ORDER — ACETAMINOPHEN 325 MG PO TABS
650.0000 mg | ORAL_TABLET | Freq: Once | ORAL | Status: AC
  Administered 2020-07-09: 650 mg via ORAL

## 2020-07-09 MED ORDER — ACETAMINOPHEN 325 MG PO TABS
ORAL_TABLET | ORAL | Status: AC
  Filled 2020-07-09: qty 2

## 2020-07-09 NOTE — Discharge Instructions (Addendum)
You influenza test was POSITIVE today.  You have been tested for COVID-19 today. If your test returns positive, you will receive a phone call from [facility] regarding your results. Negative test results are not called. Both positive and negative results area always visible on MyChart. If you do not have a MyChart account, sign up instructions are provided in your discharge papers. Please do not hesitate to contact us should you have questions or concerns.

## 2020-07-09 NOTE — ED Provider Notes (Signed)
[facility]   [phone] 07/09/20 Arrival Time: 1301  ASSESSMENT & PLAN:  1. Cough   2. Fever, unspecified fever cause   3. Sore throat   4. Influenza A     Influenza A +.  COVID-19 testing sent. See letter/work note on file for self-isolation guidelines. OTC symptom care as needed.  Meds ordered this encounter  Medications  . acetaminophen (TYLENOL) tablet 650 mg  . HYDROcodone-homatropine (HYCODAN) 5-1.5 MG/5ML syrup    Sig: Take 5 mLs by mouth every 6 (six) hours as needed for cough.    Dispense:  90 mL    Refill:  0  . oseltamivir (TAMIFLU) 75 MG capsule    Sig: Take 1 capsule (75 mg total) by mouth 2 (two) times daily for 5 days.    Dispense:  10 capsule    Refill:  0     Follow-up Information    Jovita Kussmaul, MD.   Specialty: Family Medicine Why: As needed. Contact information: [address] [place] [address] [phone]               Reviewed expectations re: course of current medical issues. Questions answered. Outlined signs and symptoms indicating need for more acute intervention. Understanding verbalized. After Visit Summary given.   SUBJECTIVE: History from: patient. Rose Savage is a 20 y.o. female who presents with worries regarding COVID-19. Known COVID-19 contact: none. Recent travel: none. Reports: subj fever and chills, cough, ST, body aches; abrupt onset approx 2 d ago. Denies: difficulty breathing. Normal PO intake without n/v/d.    OBJECTIVE:  Vitals:   07/09/20 1315 07/09/20 1319  BP: 119/60   Pulse: (!) 118   Resp: (!) 22   Temp: (!) 102.7 F (39.3 C)   TempSrc: Oral   SpO2: 99%   Weight:  78.5 kg  Height:  5\' 2"  (1.575 m)    Abnormal VS noted.  General appearance: alert; no distress Eyes: PERRLA; EOMI; conjunctiva normal HENT: [place]; AT; with nasal congestion Neck: supple  Lungs: speaks full sentences without difficulty; unlabored Extremities: no edema Skin: warm and dry Neurologic: normal  gait Psychological: alert and cooperative; normal mood and affect  Labs:  Labs Reviewed  SARS CORONAVIRUS 2 (TAT 6-24 HRS)     No Known Allergies  Past Medical History:  Diagnosis Date  . Bipolar disorder (HCC)   . PCOS (polycystic ovarian syndrome)    Social History   Socioeconomic History  . Marital status: Single    Spouse name: Not on file  . Number of children: Not on file  . Years of education: Not on file  . Highest education level: Not on file  Occupational History  . Not on file  Tobacco Use  . Smoking status: Never Smoker  . Smokeless tobacco: Never Used  Substance and Sexual Activity  . Alcohol use: No    Alcohol/week: 0.0 standard drinks  . Drug use: No  . Sexual activity: Never  Other Topics Concern  . Not on file  Social History Narrative   Lives at home with Mom, 4 sisters and 1 nephew. She is in 8th grade.    Social Determinants of Health   Financial Resource Strain: Not on file  Food Insecurity: Not on file  Transportation Needs: Not on file  Physical Activity: Not on file  Stress: Not on file  Social Connections: Not on file  Intimate Partner Violence: Not on file   Family History  Adopted: Yes   History reviewed. No  pertinent surgical history.   Mardella Layman, MD 07/09/20 [phone]

## 2020-07-09 NOTE — ED Triage Notes (Signed)
Presents today with cough, sorethroat, body aches and fever x 5 days.

## 2020-07-11 LAB — POC INFLUENZA A AND B ANTIGEN (URGENT CARE ONLY)
Influenza A Ag: POSITIVE — AB
Influenza B Ag: NEGATIVE

## 2020-09-01 ENCOUNTER — Other Ambulatory Visit: Payer: Self-pay | Admitting: Pediatrics

## 2020-09-01 DIAGNOSIS — E282 Polycystic ovarian syndrome: Secondary | ICD-10-CM

## 2020-10-09 ENCOUNTER — Other Ambulatory Visit: Payer: Self-pay | Admitting: Family

## 2020-10-09 DIAGNOSIS — L700 Acne vulgaris: Secondary | ICD-10-CM

## 2020-10-30 ENCOUNTER — Other Ambulatory Visit: Payer: Self-pay | Admitting: Family

## 2020-10-30 DIAGNOSIS — L700 Acne vulgaris: Secondary | ICD-10-CM

## 2021-01-09 ENCOUNTER — Other Ambulatory Visit: Payer: Self-pay | Admitting: Family

## 2021-03-13 ENCOUNTER — Other Ambulatory Visit: Payer: Self-pay | Admitting: Family

## 2021-03-13 DIAGNOSIS — L700 Acne vulgaris: Secondary | ICD-10-CM

## 2021-04-30 ENCOUNTER — Other Ambulatory Visit: Payer: Self-pay | Admitting: Pediatrics

## 2021-04-30 DIAGNOSIS — E282 Polycystic ovarian syndrome: Secondary | ICD-10-CM

## 2021-05-11 ENCOUNTER — Other Ambulatory Visit: Payer: Self-pay | Admitting: Pediatrics

## 2021-05-11 ENCOUNTER — Telehealth: Payer: Self-pay

## 2021-05-11 DIAGNOSIS — E282 Polycystic ovarian syndrome: Secondary | ICD-10-CM

## 2021-05-11 NOTE — Telephone Encounter (Signed)
Parent called requesting refill for patient for birth control. Has not been seen since 2018. Will need OV for further refill. Please call and schedule asap.

## 2021-05-19 ENCOUNTER — Other Ambulatory Visit: Payer: Self-pay

## 2021-05-19 ENCOUNTER — Other Ambulatory Visit (HOSPITAL_COMMUNITY)
Admission: RE | Admit: 2021-05-19 | Discharge: 2021-05-19 | Disposition: A | Discharge status: Home / Self Care | Payer: Medicaid Other | Source: Ambulatory Visit | Attending: Family | Admitting: Family

## 2021-05-19 ENCOUNTER — Ambulatory Visit (INDEPENDENT_AMBULATORY_CARE_PROVIDER_SITE_OTHER): Payer: Medicaid Other | Admitting: Family

## 2021-05-19 VITALS — BP 114/82 | HR 69 | Ht 61.81 in | Wt 187.2 lb

## 2021-05-19 DIAGNOSIS — E282 Polycystic ovarian syndrome: Secondary | ICD-10-CM | POA: Diagnosis not present

## 2021-05-19 DIAGNOSIS — Z113 Encounter for screening for infections with a predominantly sexual mode of transmission: Secondary | ICD-10-CM | POA: Diagnosis not present

## 2021-05-19 DIAGNOSIS — Z3202 Encounter for pregnancy test, result negative: Secondary | ICD-10-CM | POA: Diagnosis not present

## 2021-05-19 DIAGNOSIS — L700 Acne vulgaris: Secondary | ICD-10-CM | POA: Diagnosis not present

## 2021-05-19 LAB — POCT URINE PREGNANCY: Preg Test, Ur: NEGATIVE

## 2021-05-19 MED ORDER — NORGESTIMATE-ETH ESTRADIOL 0.25-35 MG-MCG PO TABS: 1.0000 | ORAL_TABLET | Freq: Every day | ORAL | 3 refills | Status: DC

## 2021-05-19 MED ORDER — SPIRONOLACTONE 100 MG PO TABS: 100.0000 mg | ORAL_TABLET | Freq: Every day | ORAL | 1 refills | Status: DC

## 2021-05-19 NOTE — Progress Notes (Signed)
History was provided by the patient and mother.  Rose Savage is a 20 y.o. female who is here for birth control. Presents with grandmother.   PCP confirmed? Yes.    Derrel Nip, MD  HPI:   -Working at [employer] on [employer]; 2-4 weeks  -before that was at [employer] and needed some place quieter  -ran out of birth control about 2 weeks ago  -no bleeding in 4-5 months  -spironolactone still taking  -female partner, interested in condoms   Patient Active Problem List   Diagnosis Date Noted   Bipolar disorder, current episode mixed, mild (HCC)    Tremor 01/27/2019   PCOS (polycystic ovarian syndrome) 08/28/2014   Abnormal weight gain 08/28/2014   Acne vulgaris 08/28/2014    Current Outpatient Medications on File Prior to Visit  Medication Sig Dispense Refill   adapalene (DIFFERIN) 0.1 % gel APPLY TOPICALLY AT BEDTIME AND WASH OFF IN THE MORNING 45 g 6   [facility] MELATONIN 3 MG TABS tablet TAKE 1 TABLET (3 MG TOTAL) BY MOUTH AT BEDTIME AS NEEDED. 90 tablet 3   divalproex (DEPAKOTE ER) 500 MG 24 hr tablet Take 500 mg by mouth 2 (two) times daily.  2   FANAPT 2 MG TABS Take by mouth at bedtime.     HYDROcodone-homatropine (HYCODAN) 5-1.5 MG/5ML syrup Take 5 mLs by mouth every 6 (six) hours as needed for cough. 90 mL 0   norgestimate-ethinyl estradiol (ORTHO-CYCLEN) 0.25-35 MG-MCG tablet TAKE 1 TABLET BY MOUTH EVERY DAY 84 tablet 1   predniSONE (DELTASONE) 20 MG tablet Take 2 tablets (40 mg total) by mouth daily. 10 tablet 0   spironolactone (ALDACTONE) 100 MG tablet TAKE 1 TABLET BY MOUTH EVERY DAY 90 tablet 1   SPRINTEC 28 0.25-35 MG-MCG tablet TAKE 1 TABLET BY MOUTH EVERY DAY 28 tablet 5   STRATTERA 80 MG capsule Take 80 mg by mouth daily.  3   No current facility-administered medications on file prior to visit.    No Known Allergies  Physical Exam:    Vitals:   05/19/21 1037  BP: 114/82  Pulse: 69  Weight: 187 lb 3.2 oz (84.9 kg)  Height: 5' 1.81" (1.57 m)   Wt  Readings from Last 3 Encounters:  05/19/21 187 lb 3.2 oz (84.9 kg)  07/09/20 173 lb (78.5 kg) (93 %, Z= 1.44)*  06/21/20 173 lb (78.5 kg) (93 %, Z= 1.44)*   * Growth percentiles are based on CDC (Girls, 2-20 Years) data.     Growth percentile SmartLinks can only be used for patients less than 64 years old. No LMP recorded. (Menstrual status: Oral contraceptives).  Physical Exam Vitals reviewed.  Constitutional:      Appearance: Normal appearance. She is not toxic-appearing.  HENT:     Head: Normocephalic.     Mouth/Throat:     Pharynx: Oropharynx is clear.  Eyes:     General: No scleral icterus.    Extraocular Movements: Extraocular movements intact.     Pupils: Pupils are equal, round, and reactive to light.  Cardiovascular:     Rate and Rhythm: Normal rate and regular rhythm.     Heart sounds: No murmur heard. Pulmonary:     Effort: Pulmonary effort is normal.  Musculoskeletal:        General: No swelling. Normal range of motion.     Cervical back: Normal range of motion and neck supple.  Lymphadenopathy:     Cervical: No cervical adenopathy.  Skin:  General: Skin is warm and dry.     Capillary Refill: Capillary refill takes less than 2 seconds.     Findings: No rash.  Neurological:     General: No focal deficit present.     Mental Status: She is alert and oriented to person, place, and time.     Motor: No tremor.  Psychiatric:        Mood and Affect: Mood normal.     Assessment/Plan:  1. PCOS (polycystic ovarian syndrome) -PCOS symptoms reviewed with patient and GM -comorb monitoring labs due today  -will also screen for HIV/RPR, vitamin D deficiency -discussed difference in menstrual suppression with continuous cycling vs secondary amenorrhea of PCOS. Refilled COC Rx for continuous cycling; reviewed use. Return in 3 months or sooner if needed.   - CBC with Differential/Platelet - Comprehensive metabolic panel - Hemoglobin A1c - Lipid panel - VITAMIN D 25  Hydroxy (Vit-D Deficiency, Fractures) - HIV Antibody (routine testing w rflx) - RPR - norgestimate-ethinyl estradiol (ORTHO-CYCLEN) 0.25-35 MG-MCG tablet; Take 1 tablet by mouth daily.  Dispense: 84 tablet; Refill: 3  2. Acne vulgaris - spironolactone (ALDACTONE) 100 MG tablet; Take 1 tablet (100 mg total) by mouth daily.  Dispense: 90 tablet; Refill: 1  3. Routine screening for STI (sexually transmitted infection) - Urine cytology ancillary only  4. Urine pregnancy test negative - POCT urine pregnancy

## 2021-05-21 ENCOUNTER — Encounter: Payer: Self-pay | Admitting: Family

## 2021-05-22 LAB — COMPREHENSIVE METABOLIC PANEL
AG Ratio: 1.3 (calc) (ref 1.0–2.5)
ALT: 21 U/L (ref 6–29)
AST: 22 U/L (ref 10–30)
Albumin: 4.2 g/dL (ref 3.6–5.1)
Alkaline phosphatase (APISO): 65 U/L (ref 31–125)
BUN: 9 mg/dL (ref 7–25)
CO2: 22 mmol/L (ref 20–32)
Calcium: 9.5 mg/dL (ref 8.6–10.2)
Chloride: 102 mmol/L (ref 98–110)
Creat: 0.78 mg/dL (ref 0.50–0.96)
Globulin: 3.2 g/dL (calc) (ref 1.9–3.7)
Glucose, Bld: 78 mg/dL (ref 65–99)
Potassium: 4.2 mmol/L (ref 3.5–5.3)
Sodium: 136 mmol/L (ref 135–146)
Total Bilirubin: 0.4 mg/dL (ref 0.2–1.2)
Total Protein: 7.4 g/dL (ref 6.1–8.1)

## 2021-05-22 LAB — URINE CYTOLOGY ANCILLARY ONLY
Bacterial Vaginitis-Urine: NEGATIVE
Candida Urine: NEGATIVE
Chlamydia: NEGATIVE
Comment: NEGATIVE
Comment: NEGATIVE
Comment: NORMAL
Neisseria Gonorrhea: NEGATIVE
Trichomonas: NEGATIVE

## 2021-05-22 LAB — HEMOGLOBIN A1C
Hgb A1c MFr Bld: 5.3 % of total Hgb (ref <5.7)
Mean Plasma Glucose: 105 mg/dL
eAG (mmol/L): 5.8 mmol/L

## 2021-05-22 LAB — CBC WITH DIFFERENTIAL/PLATELET
Absolute Monocytes: 570 cells/uL (ref 200–950)
Basophils Absolute: 23 cells/uL (ref 0–200)
Basophils Relative: 0.4 %
Eosinophils Absolute: 23 cells/uL (ref 15–500)
Eosinophils Relative: 0.4 %
HCT: 37.5 % (ref 35.0–45.0)
Hemoglobin: 12.6 g/dL (ref 11.7–15.5)
Lymphs Abs: 1807 cells/uL (ref 850–3900)
MCH: 30.8 pg (ref 27.0–33.0)
MCHC: 33.6 g/dL (ref 32.0–36.0)
MCV: 91.7 fL (ref 80.0–100.0)
MPV: 11.5 fL (ref 7.5–12.5)
Monocytes Relative: 10 %
Neutro Abs: 3278 cells/uL (ref 1500–7800)
Neutrophils Relative %: 57.5 %
Platelets: 175 10*3/uL (ref 140–400)
RBC: 4.09 10*6/uL (ref 3.80–5.10)
RDW: 13.1 % (ref 11.0–15.0)
Total Lymphocyte: 31.7 %
WBC: 5.7 10*3/uL (ref 3.8–10.8)

## 2021-05-22 LAB — LIPID PANEL
Cholesterol: 142 mg/dL (ref <200)
HDL: 61 mg/dL (ref >=50)
LDL Cholesterol (Calc): 66 mg/dL (calc)
Non-HDL Cholesterol (Calc): 81 mg/dL (calc) (ref <130)
Total CHOL/HDL Ratio: 2.3 (calc) (ref <5.0)
Triglycerides: 69 mg/dL (ref <150)

## 2021-05-22 LAB — VITAMIN D 25 HYDROXY (VIT D DEFICIENCY, FRACTURES): Vit D, 25-Hydroxy: 52 ng/mL (ref 30–100)

## 2021-05-22 LAB — HIV ANTIBODY (ROUTINE TESTING W REFLEX): HIV 1&2 Ab, 4th Generation: NONREACTIVE

## 2021-05-22 LAB — RPR: RPR Ser Ql: NONREACTIVE

## 2021-08-18 ENCOUNTER — Ambulatory Visit (INDEPENDENT_AMBULATORY_CARE_PROVIDER_SITE_OTHER): Payer: Medicaid Other | Admitting: Family

## 2021-08-18 ENCOUNTER — Other Ambulatory Visit: Payer: Self-pay

## 2021-08-18 ENCOUNTER — Encounter: Payer: Self-pay | Admitting: Family

## 2021-08-18 VITALS — BP 104/68 | HR 74 | Ht 61.81 in | Wt 171.0 lb

## 2021-08-18 DIAGNOSIS — Z3202 Encounter for pregnancy test, result negative: Secondary | ICD-10-CM | POA: Diagnosis not present

## 2021-08-18 DIAGNOSIS — L700 Acne vulgaris: Secondary | ICD-10-CM

## 2021-08-18 DIAGNOSIS — E282 Polycystic ovarian syndrome: Secondary | ICD-10-CM | POA: Diagnosis not present

## 2021-08-18 DIAGNOSIS — J302 Other seasonal allergic rhinitis: Secondary | ICD-10-CM | POA: Diagnosis not present

## 2021-08-18 LAB — POCT URINE PREGNANCY: Preg Test, Ur: NEGATIVE

## 2021-08-18 MED ORDER — LEVOCETIRIZINE DIHYDROCHLORIDE 5 MG PO TABS: 5.0000 mg | ORAL_TABLET | Freq: Every evening | ORAL | 0 refills | Status: DC

## 2021-08-18 MED ORDER — FLUTICASONE PROPIONATE 50 MCG/ACT NA SUSP: 1.0000 | Freq: Every day | NASAL | 12 refills | Status: DC

## 2021-08-18 NOTE — Progress Notes (Signed)
History was provided by the patient. ? ?Rose Savage is a 21 y.o. female who is here for PCOS, acne vulgaris.  ? ?PCP confirmed? Yes.   ? Derrel Nip, MD ? ?Plan at last visit:  ?1. PCOS (polycystic ovarian syndrome) ?-PCOS symptoms reviewed with patient and GM ?-comorb monitoring labs due today  ?-will also screen for HIV/RPR, vitamin D deficiency ?-discussed difference in menstrual suppression with continuous cycling vs secondary amenorrhea of PCOS. Refilled COC Rx for continuous cycling; reviewed use. Return in 3 months or sooner if needed.  ?  ?- CBC with Differential/Platelet ?- Comprehensive metabolic panel ?- Hemoglobin A1c ?- Lipid panel ?- VITAMIN D 25 Hydroxy (Vit-D Deficiency, Fractures) ?- HIV Antibody (routine testing w rflx) ?- RPR ?- norgestimate-ethinyl estradiol (ORTHO-CYCLEN) 0.25-35 MG-MCG tablet; Take 1 tablet by mouth daily.  Dispense: 84 tablet; Refill: 3 ?  ?(All labs WNL)  ? ?2. Acne vulgaris ?- spironolactone (ALDACTONE) 100 MG tablet; Take 1 tablet (100 mg total) by mouth daily.  Dispense: 90 tablet; Refill: 1 ?  ?3. Routine screening for STI (sexually transmitted infection) ?- Urine cytology ancillary only ?  ?4. Urine pregnancy test negative ?- POCT urine pregnancy ? ? ?HPI:   ? ?-using store bought soap and cream for face; spironolactone  ?-no breakthrough bleeding and no period - likes that  ?-not really sleeping much at night, sleeping mostly during the day  ?-otherwise no issues  ?-birthday last month was better than before but not expected; got a birthday cake with rose on it and her mom let her cut pieces for everyone so that was nice  ?-having some issues with allergies; has taken Claritin and Zyrtec in the past but nothing now  ? ? ?Patient Active Problem List  ? Diagnosis Date Noted  ? Bipolar disorder, current episode mixed, mild (HCC)   ? Tremor 01/27/2019  ? PCOS (polycystic ovarian syndrome) 08/28/2014  ? Abnormal weight gain 08/28/2014  ? Acne vulgaris 08/28/2014   ? ? ?Current Outpatient Medications on File Prior to Visit  ?Medication Sig Dispense Refill  ? adapalene (DIFFERIN) 0.1 % gel APPLY TOPICALLY AT BEDTIME AND WASH OFF IN THE MORNING 45 g 6  ? [facility] MELATONIN 3 MG TABS tablet TAKE 1 TABLET (3 MG TOTAL) BY MOUTH AT BEDTIME AS NEEDED. 90 tablet 3  ? divalproex (DEPAKOTE ER) 500 MG 24 hr tablet Take 500 mg by mouth 2 (two) times daily.  2  ? FANAPT 2 MG TABS Take by mouth at bedtime.    ? HYDROcodone-homatropine (HYCODAN) 5-1.5 MG/5ML syrup Take 5 mLs by mouth every 6 (six) hours as needed for cough. 90 mL 0  ? norgestimate-ethinyl estradiol (ORTHO-CYCLEN) 0.25-35 MG-MCG tablet Take 1 tablet by mouth daily. 84 tablet 3  ? predniSONE (DELTASONE) 20 MG tablet Take 2 tablets (40 mg total) by mouth daily. 10 tablet 0  ? spironolactone (ALDACTONE) 100 MG tablet Take 1 tablet (100 mg total) by mouth daily. 90 tablet 1  ? STRATTERA 80 MG capsule Take 80 mg by mouth daily.  3  ? ?No current facility-administered medications on file prior to visit.  ? ? ?No Known Allergies ? ?Physical Exam:  ?  ?Vitals:  ? 08/18/21 1101  ?BP: 104/68  ?Pulse: 74  ?Weight: 171 lb (77.6 kg)  ?Height: 5' 1.81" (1.57 m)  ? ?Wt Readings from Last 3 Encounters:  ?08/18/21 171 lb (77.6 kg)  ?05/19/21 187 lb 3.2 oz (84.9 kg)  ?07/09/20 173 lb (78.5 kg) (93 %, Z=  1.44)*  ? ?* Growth percentiles are based on CDC (Girls, 2-20 Years) data.  ? ? ?Growth percentile SmartLinks can only be used for patients less than 27 years old. ?No LMP recorded. (Menstrual status: Oral contraceptives). ? ?Physical Exam ?Constitutional:   ?   General: She is not in acute distress. ?   Appearance: She is well-developed.  ?HENT:  ?   Head: Normocephalic and atraumatic.  ?   Nose: Congestion present.  ?   Mouth/Throat:  ?   Pharynx: Oropharynx is clear.  ?Eyes:  ?   General: No scleral icterus. ?   Pupils: Pupils are equal, round, and reactive to light.  ?Neck:  ?   Thyroid: No thyromegaly.  ?Cardiovascular:  ?   Rate and Rhythm:  Normal rate and regular rhythm.  ?   Heart sounds: Normal heart sounds. No murmur heard. ?Pulmonary:  ?   Effort: Pulmonary effort is normal.  ?   Breath sounds: Normal breath sounds.  ?Abdominal:  ?   Palpations: Abdomen is soft.  ?Musculoskeletal:     ?   General: Normal range of motion.  ?   Cervical back: Normal range of motion and neck supple.  ?Lymphadenopathy:  ?   Cervical: No cervical adenopathy.  ?Skin: ?   General: Skin is warm and dry.  ?   Findings: No rash.  ?Neurological:  ?   Mental Status: She is alert and oriented to person, place, and time.  ?   Cranial Nerves: No cranial nerve deficit.  ?Psychiatric:     ?   Behavior: Behavior normal.     ?   Thought Content: Thought content normal.     ?   Judgment: Judgment normal.  ?  ?PHQ-SADS Last 3 Score only 08/18/2021 05/13/2020 01/23/2019  ?PHQ-15 Score 2 - -  ?Total GAD-7 Score 7 - -  ?PHQ Adolescent Score 6 4 0  ? ? ?Assessment/Plan: ?1. PCOS (polycystic ovarian syndrome) ?-continue with continuous cycling, no BTB  ?-return in 3 months  ?-labs were WNL at last visit, recheck annually or PRN  ? ?2. Acne vulgaris ?-continue with spironolactone  ? ?3. Seasonal allergies ?-reviewed xyzal and flonase use  ?-return as needed  ? ? ? ?

## 2021-11-14 ENCOUNTER — Other Ambulatory Visit: Payer: Self-pay | Admitting: Family

## 2021-11-17 ENCOUNTER — Other Ambulatory Visit: Payer: Self-pay | Admitting: Family

## 2021-11-17 ENCOUNTER — Ambulatory Visit (INDEPENDENT_AMBULATORY_CARE_PROVIDER_SITE_OTHER): Payer: Medicaid Other | Admitting: Family

## 2021-11-17 ENCOUNTER — Telehealth: Payer: Self-pay | Admitting: Family

## 2021-11-17 ENCOUNTER — Encounter: Payer: Self-pay | Admitting: Family

## 2021-11-17 VITALS — BP 116/71 | HR 81 | Ht 62.0 in | Wt 170.6 lb

## 2021-11-17 DIAGNOSIS — E559 Vitamin D deficiency, unspecified: Secondary | ICD-10-CM | POA: Diagnosis not present

## 2021-11-17 DIAGNOSIS — E282 Polycystic ovarian syndrome: Secondary | ICD-10-CM

## 2021-11-17 MED ORDER — NORGESTIMATE-ETH ESTRADIOL 0.25-35 MG-MCG PO TABS: 1.0000 | ORAL_TABLET | Freq: Every day | ORAL | 3 refills | Status: DC

## 2021-11-17 NOTE — Telephone Encounter (Signed)
Rose Savage needs a refill on her birth control can be sent to the pharmacy

## 2021-11-17 NOTE — Progress Notes (Signed)
History was provided by the patient.  Rose Savage is a 21 y.o. female who is here for PCOS follow-up.   PCP confirmed? Yes.    Derrel Nip, MD  Plan from last visit:  Assessment/Plan: 1. PCOS (polycystic ovarian syndrome) -continue with continuous cycling, no BTB  -return in 3 months  -labs were WNL at last visit, recheck annually or PRN    2. Acne vulgaris -continue with spironolactone    3. Seasonal allergies -reviewed xyzal and flonase use  -return as needed     HPI:   -has not been taking any meds since past 3 months  -was living with sister because of mom and issues with siblings; sent her to sister's house to live  -moved back in with mom about 3 weeks ago  -for some reason birth control gives her a weird feeling  -had some spotting about 3-4 days ago for one day; no period since birth control  -not sexually active  -no refills needed    Patient Active Problem List   Diagnosis Date Noted   Bipolar disorder, current episode mixed, mild (HCC)    Tremor 01/27/2019   PCOS (polycystic ovarian syndrome) 08/28/2014   Abnormal weight gain 08/28/2014   Acne vulgaris 08/28/2014    Current Outpatient Medications on File Prior to Visit  Medication Sig Dispense Refill   [facility] MELATONIN 3 MG TABS tablet TAKE 1 TABLET (3 MG TOTAL) BY MOUTH AT BEDTIME AS NEEDED. 90 tablet 3   divalproex (DEPAKOTE ER) 500 MG 24 hr tablet Take 500 mg by mouth 2 (two) times daily.  2   FANAPT 2 MG TABS Take by mouth at bedtime.     fluticasone (FLONASE) 50 MCG/ACT nasal spray Place 1 spray into both nostrils daily. 16 g 12   levocetirizine (XYZAL) 5 MG tablet TAKE 1 TABLET BY MOUTH EVERY DAY IN THE EVENING 90 tablet 0   norgestimate-ethinyl estradiol (ORTHO-CYCLEN) 0.25-35 MG-MCG tablet Take 1 tablet by mouth daily. 84 tablet 3   spironolactone (ALDACTONE) 100 MG tablet Take 1 tablet (100 mg total) by mouth daily. 90 tablet 1   adapalene (DIFFERIN) 0.1 % gel APPLY TOPICALLY AT BEDTIME  AND WASH OFF IN THE MORNING (Patient not taking: Reported on 08/18/2021) 45 g 6   HYDROcodone-homatropine (HYCODAN) 5-1.5 MG/5ML syrup Take 5 mLs by mouth every 6 (six) hours as needed for cough. (Patient not taking: Reported on 08/18/2021) 90 mL 0   predniSONE (DELTASONE) 20 MG tablet Take 2 tablets (40 mg total) by mouth daily. (Patient not taking: Reported on 08/18/2021) 10 tablet 0   STRATTERA 80 MG capsule Take 80 mg by mouth daily.  3   No current facility-administered medications on file prior to visit.    No Known Allergies  Physical Exam:    Vitals:   11/17/21 1018  BP: 116/71  Pulse: 81  Weight: 170 lb 9.6 oz (77.4 kg)  Height: 5\' 2"  (1.575 m)    Growth %ile SmartLinks can only be used for patients less than 59 years old. No LMP recorded. (Menstrual status: Oral contraceptives).  Physical Exam Constitutional:      General: She is not in acute distress.    Appearance: She is well-developed.  HENT:     Head: Normocephalic and atraumatic.  Eyes:     General: No scleral icterus.    Pupils: Pupils are equal, round, and reactive to light.  Neck:     Thyroid: No thyromegaly.  Cardiovascular:     Rate and  Rhythm: Normal rate and regular rhythm.     Heart sounds: Normal heart sounds. No murmur heard. Pulmonary:     Effort: Pulmonary effort is normal.     Breath sounds: Normal breath sounds.  Abdominal:     Palpations: Abdomen is soft.  Musculoskeletal:        General: Normal range of motion.     Cervical back: Normal range of motion and neck supple.  Lymphadenopathy:     Cervical: No cervical adenopathy.  Skin:    General: Skin is warm and dry.     Findings: No rash.  Neurological:     Mental Status: She is alert and oriented to person, place, and time.     Cranial Nerves: No cranial nerve deficit.  Psychiatric:        Behavior: Behavior normal.        Thought Content: Thought content normal.        Judgment: Judgment normal.      Assessment/Plan: 1. PCOS  (polycystic ovarian syndrome) -co-morbidity labs today, refill for OCPs; return in 3 months or sooner if needed -discussed endometrial hyperplasia as risk associated with amenorrhea 2/2 PCOS - CBC with Differential/Platelet - Comprehensive metabolic panel - Hemoglobin A1c - Lipid panel  2. Vitamin D deficiency - VITAMIN D 25 Hydroxy (Vit-D Deficiency, Fractures)

## 2021-11-18 LAB — COMPREHENSIVE METABOLIC PANEL
AG Ratio: 1.4 (calc) (ref 1.0–2.5)
ALT: 15 U/L (ref 6–29)
AST: 15 U/L (ref 10–30)
Albumin: 3.8 g/dL (ref 3.6–5.1)
Alkaline phosphatase (APISO): 36 U/L (ref 31–125)
BUN/Creatinine Ratio: 7 (calc) (ref 6–22)
BUN: 5 mg/dL — ABNORMAL LOW (ref 7–25)
CO2: 20 mmol/L (ref 20–32)
Calcium: 8.5 mg/dL — ABNORMAL LOW (ref 8.6–10.2)
Chloride: 104 mmol/L (ref 98–110)
Creat: 0.69 mg/dL (ref 0.50–0.96)
Globulin: 2.7 g/dL (calc) (ref 1.9–3.7)
Glucose, Bld: 99 mg/dL (ref 65–99)
Potassium: 3.7 mmol/L (ref 3.5–5.3)
Sodium: 134 mmol/L — ABNORMAL LOW (ref 135–146)
Total Bilirubin: 0.3 mg/dL (ref 0.2–1.2)
Total Protein: 6.5 g/dL (ref 6.1–8.1)

## 2021-11-18 LAB — CBC WITH DIFFERENTIAL/PLATELET
Absolute Monocytes: 365 cells/uL (ref 200–950)
Basophils Absolute: 21 cells/uL (ref 0–200)
Basophils Relative: 0.5 %
Eosinophils Absolute: 8 cells/uL — ABNORMAL LOW (ref 15–500)
Eosinophils Relative: 0.2 %
HCT: 38.5 % (ref 35.0–45.0)
Hemoglobin: 13.1 g/dL (ref 11.7–15.5)
Lymphs Abs: 1638 cells/uL (ref 850–3900)
MCH: 30.9 pg (ref 27.0–33.0)
MCHC: 34 g/dL (ref 32.0–36.0)
MCV: 90.8 fL (ref 80.0–100.0)
MPV: 12 fL (ref 7.5–12.5)
Monocytes Relative: 8.7 %
Neutro Abs: 2167 cells/uL (ref 1500–7800)
Neutrophils Relative %: 51.6 %
Platelets: 144 10*3/uL (ref 140–400)
RBC: 4.24 10*6/uL (ref 3.80–5.10)
RDW: 12.9 % (ref 11.0–15.0)
Total Lymphocyte: 39 %
WBC: 4.2 10*3/uL (ref 3.8–10.8)

## 2021-11-18 LAB — LIPID PANEL
Cholesterol: 117 mg/dL (ref <200)
HDL: 56 mg/dL (ref >=50)
LDL Cholesterol (Calc): 43 mg/dL (calc)
Non-HDL Cholesterol (Calc): 61 mg/dL (calc) (ref <130)
Total CHOL/HDL Ratio: 2.1 (calc) (ref <5.0)
Triglycerides: 93 mg/dL (ref <150)

## 2021-11-18 LAB — HEMOGLOBIN A1C
Hgb A1c MFr Bld: 5.1 % of total Hgb (ref <5.7)
Mean Plasma Glucose: 100 mg/dL
eAG (mmol/L): 5.5 mmol/L

## 2021-11-18 LAB — VITAMIN D 25 HYDROXY (VIT D DEFICIENCY, FRACTURES): Vit D, 25-Hydroxy: 56 ng/mL (ref 30–100)

## 2021-11-26 ENCOUNTER — Encounter: Payer: Self-pay | Admitting: Family

## 2021-12-22 ENCOUNTER — Ambulatory Visit (INDEPENDENT_AMBULATORY_CARE_PROVIDER_SITE_OTHER): Payer: Medicaid Other | Admitting: Family

## 2021-12-22 ENCOUNTER — Encounter: Payer: Self-pay | Admitting: Family

## 2021-12-22 VITALS — BP 110/68 | HR 76 | Ht 61.81 in | Wt 169.1 lb

## 2021-12-22 DIAGNOSIS — Z3202 Encounter for pregnancy test, result negative: Secondary | ICD-10-CM | POA: Diagnosis not present

## 2021-12-22 DIAGNOSIS — L680 Hirsutism: Secondary | ICD-10-CM | POA: Diagnosis not present

## 2021-12-22 DIAGNOSIS — L700 Acne vulgaris: Secondary | ICD-10-CM | POA: Diagnosis not present

## 2021-12-22 DIAGNOSIS — E282 Polycystic ovarian syndrome: Secondary | ICD-10-CM | POA: Diagnosis not present

## 2021-12-22 LAB — POCT URINE PREGNANCY: Preg Test, Ur: NEGATIVE

## 2021-12-22 MED ORDER — NORGESTIMATE-ETH ESTRADIOL 0.25-35 MG-MCG PO TABS: 1.0000 | ORAL_TABLET | Freq: Every day | ORAL | 3 refills | Status: DC

## 2021-12-22 MED ORDER — SPIRONOLACTONE 100 MG PO TABS: 100.0000 mg | ORAL_TABLET | Freq: Every day | ORAL | 1 refills | Status: DC

## 2021-12-22 NOTE — Progress Notes (Signed)
History was provided by the patient and grandmother.  Rose Savage is a 21 y.o. female who is here for PCOS, acne, hirsutism.   PCP confirmed? Yes.    Evette Georges, MD  Plan from last visit:  Assessment/Plan 11/17/21: 1. PCOS (polycystic ovarian syndrome) -co-morbidity labs today, refill for OCPs; return in 3 months or sooner if needed -discussed endometrial hyperplasia as risk associated with amenorrhea 2/2 PCOS - CBC with Differential/Platelet - Comprehensive metabolic panel - Hemoglobin A1c - Lipid panel   2. Vitamin D deficiency - VITAMIN D 25 Hydroxy (Vit-D Deficiency, Fractures)     HPI:   -checking up on new birth control  -no spotting or cramping, needs refill  -spironolactone is helping reduce hair growth on face -acne is improving   Patient Active Problem List   Diagnosis Date Noted   Bipolar disorder, current episode mixed, mild (HCC)    Tremor 01/27/2019   PCOS (polycystic ovarian syndrome) 08/28/2014   Abnormal weight gain 08/28/2014   Acne vulgaris 08/28/2014    Current Outpatient Medications on File Prior to Visit  Medication Sig Dispense Refill   [facility] MELATONIN 3 MG TABS tablet TAKE 1 TABLET (3 MG TOTAL) BY MOUTH AT BEDTIME AS NEEDED. 90 tablet 3   divalproex (DEPAKOTE ER) 500 MG 24 hr tablet Take 500 mg by mouth 2 (two) times daily.  2   FANAPT 2 MG TABS Take by mouth at bedtime.     fluticasone (FLONASE) 50 MCG/ACT nasal spray Place 1 spray into both nostrils daily. 16 g 12   levocetirizine (XYZAL) 5 MG tablet TAKE 1 TABLET BY MOUTH EVERY DAY IN THE EVENING 90 tablet 0   norgestimate-ethinyl estradiol (ORTHO-CYCLEN) 0.25-35 MG-MCG tablet Take 1 tablet by mouth daily. 84 tablet 3   spironolactone (ALDACTONE) 100 MG tablet Take 1 tablet (100 mg total) by mouth daily. 90 tablet 1   STRATTERA 80 MG capsule Take 80 mg by mouth daily.  3   amantadine (SYMMETREL) 100 MG capsule Take 100 mg by mouth 2 (two) times daily.     No current  facility-administered medications on file prior to visit.    No Known Allergies  Physical Exam:    Vitals:   12/22/21 1042  BP: 110/68  Pulse: 76  Weight: 169 lb 2 oz (76.7 kg)  Height: 5' 1.81" (1.57 m)    Growth %ile SmartLinks can only be used for patients less than 50 years old. No LMP recorded. (Menstrual status: Oral contraceptives).  Physical Exam Constitutional:      General: She is not in acute distress.    Appearance: She is well-developed.  HENT:     Head: Normocephalic and atraumatic.  Eyes:     General: No scleral icterus.    Pupils: Pupils are equal, round, and reactive to light.  Neck:     Thyroid: No thyromegaly.  Cardiovascular:     Rate and Rhythm: Normal rate and regular rhythm.     Heart sounds: Normal heart sounds. No murmur heard. Pulmonary:     Effort: Pulmonary effort is normal.     Breath sounds: Normal breath sounds.  Abdominal:     Palpations: Abdomen is soft.  Musculoskeletal:        General: Normal range of motion.     Cervical back: Normal range of motion and neck supple.  Lymphadenopathy:     Cervical: No cervical adenopathy.  Skin:    General: Skin is warm and dry.     Findings: No rash.  Comments: Facial acne clearing No facial hair present today   Neurological:     Mental Status: She is alert and oriented to person, place, and time.     Cranial Nerves: No cranial nerve deficit.  Psychiatric:        Behavior: Behavior normal.        Thought Content: Thought content normal.        Judgment: Judgment normal.      Assessment/Plan:  -doing well on current regimen; continue Ortho-Cyclen, sprinolactone -return in 3 months or sooner if needed   1. PCOS (polycystic ovarian syndrome) 2. Hirsutism 3. Acne vulgaris - norgestimate-ethinyl estradiol (ORTHO-CYCLEN) 0.25-35 MG-MCG tablet; Take 1 tablet by mouth daily.  Dispense: 84 tablet; Refill: 3 - spironolactone (ALDACTONE) 100 MG tablet; Take 1 tablet (100 mg total) by mouth  daily.  Dispense: 90 tablet; Refill: 1 4. Negative pregnancy test - POCT urine pregnancy

## 2022-03-23 ENCOUNTER — Ambulatory Visit: Payer: Medicaid Other | Admitting: Family

## 2022-04-10 ENCOUNTER — Ambulatory Visit: Payer: Medicaid Other | Admitting: Family

## 2022-04-13 ENCOUNTER — Encounter: Payer: Self-pay | Admitting: Family

## 2022-04-13 ENCOUNTER — Ambulatory Visit (INDEPENDENT_AMBULATORY_CARE_PROVIDER_SITE_OTHER): Payer: Medicaid Other | Admitting: Family

## 2022-04-13 VITALS — BP 116/67 | HR 77 | Ht 61.81 in | Wt 162.2 lb

## 2022-04-13 DIAGNOSIS — E282 Polycystic ovarian syndrome: Secondary | ICD-10-CM

## 2022-04-13 DIAGNOSIS — L700 Acne vulgaris: Secondary | ICD-10-CM | POA: Diagnosis not present

## 2022-04-13 DIAGNOSIS — L680 Hirsutism: Secondary | ICD-10-CM

## 2022-04-13 NOTE — Progress Notes (Unsigned)
History was provided by the {[id number]}.  Rose Savage is a 21 y.o. female who is here for ***.   PCP confirmed? {yes [id number]}  Jacelyn Grip, MD  Plan from last visit:  Assessment/Plan:   -doing well on current regimen; continue Ortho-Cyclen, sprinolactone -return in 3 months or sooner if needed    1. PCOS (polycystic ovarian syndrome) 2. Hirsutism 3. Acne vulgaris - norgestimate-ethinyl estradiol (ORTHO-CYCLEN) 0.25-35 MG-MCG tablet; Take 1 tablet by mouth daily.  Dispense: 84 tablet; Refill: 3 - spironolactone (ALDACTONE) 100 MG tablet; Take 1 tablet (100 mg total) by mouth daily.  Dispense: 90 tablet; Refill: 1 4. Negative pregnancy test - POCT urine pregnancy   HPI:   -no issues with birth control, no cramping  -no headaches, no vision changes  -no nausea, no vomiting  -safe to self  -not sexually active   Patient Active Problem List   Diagnosis Date Noted   Bipolar disorder, current episode mixed, mild (HCC)    Tremor 01/27/2019   PCOS (polycystic ovarian syndrome) 08/28/2014   Abnormal weight gain 08/28/2014   Acne vulgaris 08/28/2014    Current Outpatient Medications on File Prior to Visit  Medication Sig Dispense Refill   amantadine (SYMMETREL) 100 MG capsule Take 100 mg by mouth 2 (two) times daily.     [facility] MELATONIN 3 MG TABS tablet TAKE 1 TABLET (3 MG TOTAL) BY MOUTH AT BEDTIME AS NEEDED. 90 tablet 3   divalproex (DEPAKOTE ER) 500 MG 24 hr tablet Take 500 mg by mouth 2 (two) times daily.  2   FANAPT 2 MG TABS Take by mouth at bedtime.     fluticasone (FLONASE) 50 MCG/ACT nasal spray Place 1 spray into both nostrils daily. 16 g 12   levocetirizine (XYZAL) 5 MG tablet TAKE 1 TABLET BY MOUTH EVERY DAY IN THE EVENING 90 tablet 0   norgestimate-ethinyl estradiol (ORTHO-CYCLEN) 0.25-35 MG-MCG tablet Take 1 tablet by mouth daily. 84 tablet 3   spironolactone (ALDACTONE) 100 MG tablet Take 1 tablet (100 mg total) by mouth daily. 90 tablet 1   STRATTERA  80 MG capsule Take 80 mg by mouth daily.  3   No current facility-administered medications on file prior to visit.    No Known Allergies  Physical Exam:    Vitals:   04/13/22 0905  BP: 116/67  Pulse: 77  Weight: 162 lb 3.2 oz (73.6 kg)  Height: 5' 1.81" (1.57 m)    Growth %ile SmartLinks can only be used for patients less than 30 years old. No LMP recorded. (Menstrual status: Oral contraceptives).  Physical Exam   Assessment/Plan: ***

## 2022-04-14 ENCOUNTER — Encounter: Payer: Self-pay | Admitting: Family

## 2022-04-25 ENCOUNTER — Other Ambulatory Visit: Payer: Self-pay | Admitting: Family

## 2022-04-25 DIAGNOSIS — L700 Acne vulgaris: Secondary | ICD-10-CM

## 2022-07-21 ENCOUNTER — Other Ambulatory Visit: Payer: Self-pay | Admitting: Family

## 2022-07-23 ENCOUNTER — Ambulatory Visit: Payer: Medicaid Other | Admitting: Family

## 2022-08-05 ENCOUNTER — Other Ambulatory Visit: Payer: Self-pay | Admitting: Family

## 2022-08-05 DIAGNOSIS — E282 Polycystic ovarian syndrome: Secondary | ICD-10-CM

## 2022-08-10 ENCOUNTER — Ambulatory Visit: Payer: Medicaid Other | Admitting: Family

## 2022-09-03 ENCOUNTER — Ambulatory Visit (HOSPITAL_COMMUNITY)
Admission: EM | Admit: 2022-09-03 | Discharge: 2022-09-03 | Disposition: A | Discharge status: Home / Self Care | Payer: Medicaid Other | Attending: Internal Medicine | Admitting: Internal Medicine

## 2022-09-03 ENCOUNTER — Encounter (HOSPITAL_COMMUNITY): Payer: Self-pay

## 2022-09-03 DIAGNOSIS — J029 Acute pharyngitis, unspecified: Secondary | ICD-10-CM | POA: Diagnosis not present

## 2022-09-03 DIAGNOSIS — R051 Acute cough: Secondary | ICD-10-CM | POA: Diagnosis not present

## 2022-09-03 LAB — POCT RAPID STREP A, ED / UC: Streptococcus, Group A Screen (Direct): NEGATIVE

## 2022-09-03 NOTE — Discharge Instructions (Signed)
Your strep testing of your throat in the clinic is negative. Throat culture has been sent to the lab to further check for bacteria to the back of your throat, staff will call you if this is positive in the next 2-3 days.   Use the following medicines to help with symptoms: - Ibuprofen 600mg and/or Tylenol 1,000mg every 6 hours with food as needed for aches/pains or fever/chills.  - 1 tablespoon of honey in warm water and/or salt water gargles may also help with symptoms.   If you develop any new or worsening symptoms, please return.  If your symptoms are severe, please go to the emergency room.  Follow-up with your primary care provider for further evaluation and management of your symptoms as well as ongoing wellness visits.  I hope you feel better!   

## 2022-09-03 NOTE — ED Provider Notes (Signed)
[place]    CSN: [id number] Arrival date & time: 09/03/22  1040      History   Chief Complaint Chief Complaint  Patient presents with   Sore Throat   Cough    HPI Rose Savage is a 22 y.o. female.   Patient presents to urgent care for evaluation of sore throat and dry cough that started last night.  No recent known sick contacts with similar symptoms.  History of seasonal allergic rhinitis, started taking her allergy medications yesterday without much relief.  No recent antibiotic or steroid use.  Denies history of chronic respiratory problems.  Non-smoker, denies drug use.  No nausea, vomiting, diarrhea, abdominal pain, rash, headache, ear pain, dizziness, chest pain, shortness of breath, heart palpitations, or weakness.  She has not taken any Tylenol or ibuprofen in attempt to help with pain.   Sore Throat  Cough   Past Medical History:  Diagnosis Date   Bipolar disorder ([place])    PCOS (polycystic ovarian syndrome)     Patient Active Problem List   Diagnosis Date Noted   Bipolar disorder, current episode mixed, mild ([place])    Tremor 01/27/2019   PCOS (polycystic ovarian syndrome) 08/28/2014   Abnormal weight gain 08/28/2014   Acne vulgaris 08/28/2014    History reviewed. No pertinent surgical history.  OB History   No obstetric history on file.      Home Medications    Prior to Admission medications   Medication Sig Start Date End Date Taking? Authorizing Provider  amantadine (SYMMETREL) 100 MG capsule Take 100 mg by mouth 2 (two) times daily. 10/13/21   [provider]  [facility] MELATONIN 3 MG TABS tablet TAKE 1 TABLET (3 MG TOTAL) BY MOUTH AT BEDTIME AS NEEDED. 01/09/21   Trude Mcburney, FNP  divalproex (DEPAKOTE ER) 500 MG 24 hr tablet Take 500 mg by mouth 2 (two) times daily. 08/07/15   [provider]  FANAPT 2 MG TABS Take by mouth at bedtime. 03/24/20   [provider]  fluticasone (FLONASE) 50 MCG/ACT nasal  spray Place 1 spray into both nostrils daily. 08/18/21   Parthenia Ames, NP  levocetirizine (XYZAL) 5 MG tablet TAKE 1 TABLET BY MOUTH EVERY DAY IN THE EVENING 08/06/22   Parthenia Ames, NP  norgestimate-ethinyl estradiol (ORTHO-CYCLEN) 0.25-35 MG-MCG tablet TAKE 1 TABLET BY MOUTH EVERY DAY 08/06/22   Parthenia Ames, NP  spironolactone (ALDACTONE) 100 MG tablet TAKE 1 TABLET BY MOUTH EVERY DAY 04/25/22   Parthenia Ames, NP  STRATTERA 80 MG capsule Take 80 mg by mouth daily. 06/19/15   [provider]    Family History Family History  Adopted: Yes  Problem Relation Age of Onset   Healthy Mother     Social History Social History   Tobacco Use   Smoking status: Never    Passive exposure: Never   Smokeless tobacco: Never  Substance Use Topics   Alcohol use: No    Alcohol/week: 0.0 standard drinks of alcohol   Drug use: No     Allergies   Patient has no known allergies.   Review of Systems Review of Systems  Respiratory:  Positive for cough.   Per HPI   Physical Exam Triage Vital Signs ED Triage Vitals  Enc Vitals Group     BP 09/03/22 1248 112/77     Pulse Rate 09/03/22 1248 87     Resp 09/03/22 1248 18     Temp 09/03/22 1248 98.2  F (36.8 C)     Temp Source 09/03/22 1248 Oral     SpO2 09/03/22 1248 98 %     Weight --      Height --      Head Circumference --      Peak Flow --      Pain Score 09/03/22 1249 2     Pain Loc --      Pain Edu? --      Excl. in [place]? --    No data found.  Updated Vital Signs BP 112/77 (BP Location: Left Arm)   Pulse 87   Temp 98.2 F (36.8 C) (Oral)   Resp 18   SpO2 98%   Visual Acuity Right Eye Distance:   Left Eye Distance:   Bilateral Distance:    Right Eye Near:   Left Eye Near:    Bilateral Near:     Physical Exam Vitals and nursing note reviewed.  Constitutional:      Appearance: She is not ill-appearing or toxic-appearing.  HENT:     Head: Normocephalic and atraumatic.     Right Ear: Hearing,  tympanic membrane, ear canal and external ear normal.     Left Ear: Hearing, tympanic membrane, ear canal and external ear normal.     Nose: Nose normal.     Mouth/Throat:     Lips: Pink.     Mouth: Mucous membranes are moist. No injury.     Tongue: No lesions. Tongue does not deviate from midline.     Palate: No mass and lesions.     Pharynx: Oropharynx is clear. Uvula midline. Posterior oropharyngeal erythema present. No pharyngeal swelling, oropharyngeal exudate or uvula swelling.     Tonsils: No tonsillar exudate or tonsillar abscesses.  Eyes:     General: Lids are normal. Vision grossly intact. Gaze aligned appropriately.     Extraocular Movements: Extraocular movements intact.     Conjunctiva/sclera: Conjunctivae normal.  Cardiovascular:     Rate and Rhythm: Normal rate and regular rhythm.     Heart sounds: Normal heart sounds, S1 normal and S2 normal.  Pulmonary:     Effort: Pulmonary effort is normal. No respiratory distress.     Breath sounds: Normal breath sounds and air entry.  Musculoskeletal:     Cervical back: Neck supple.  Skin:    General: Skin is warm and dry.     Capillary Refill: Capillary refill takes less than 2 seconds.     Findings: No rash.  Neurological:     General: No focal deficit present.     Mental Status: She is alert and oriented to person, place, and time. Mental status is at baseline.     Cranial Nerves: No dysarthria or facial asymmetry.  Psychiatric:        Mood and Affect: Mood normal.        Speech: Speech normal.        Behavior: Behavior normal.        Thought Content: Thought content normal.        Judgment: Judgment normal.      UC Treatments / Results  Labs (all labs ordered are listed, but only abnormal results are displayed) Labs Reviewed  CULTURE, GROUP A STREP [facility])  POCT RAPID STREP A, ED / UC    EKG   Radiology No results found.  Procedures Procedures (including critical care time)  Medications Ordered in  UC Medications - No data to display  Initial Impression / Assessment  and Plan / UC Course  I have reviewed the triage vital signs and the nursing notes.  Pertinent labs & imaging results that were available during my care of the patient were reviewed by me and considered in my medical decision making (see chart for details).  1.  Viral pharyngitis, acute cough Viral pharyngitis Group A strep testing in clinic is negative, throat culture is pending. Will call patient with positive results and treat based on throat culture if necessary. Deferred monospot testing today due to timing of illness and clinical presentation. Low suspicion for peritonsillar abscess, HEENT exam is stable and without red flag signs. Patient to continue using over the counter medications for symptomatic relief. May use salt water gargles and honey in warm water/warm tea for further symptomatic relief.   Discussed physical exam and available lab work findings in clinic with patient.  Counseled patient regarding appropriate use of medications and potential side effects for all medications recommended or prescribed today. Discussed red flag signs and symptoms of worsening condition,when to call the PCP office, return to urgent care, and when to seek higher level of care in the emergency department. Patient verbalizes understanding and agreement with plan. All questions answered. Patient discharged in stable condition.   Final Clinical Impressions(s) / UC Diagnoses   Final diagnoses:  Viral pharyngitis  Acute cough     Discharge Instructions      Your strep testing of your throat in the clinic is negative. Throat culture has been sent to the lab to further check for bacteria to the back of your throat, staff will call you if this is positive in the next 2-3 days.   Use the following medicines to help with symptoms: - Ibuprofen 600mg  and/or Tylenol 1,000mg  every 6 hours with food as needed for aches/pains or  fever/chills.  - 1 tablespoon of honey in warm water and/or salt water gargles may also help with symptoms.   If you develop any new or worsening symptoms, please return.  If your symptoms are severe, please go to the emergency room.  Follow-up with your primary care provider for further evaluation and management of your symptoms as well as ongoing wellness visits.  I hope you feel better!    ED Prescriptions   None    PDMP not reviewed this encounter.   Talbot Grumbling, Enoch 09/03/22 1357

## 2022-09-03 NOTE — ED Triage Notes (Signed)
Pt c/o sore throat, cough, and swollen tonsils with white patches since last night. Denies taking any meds.

## 2022-09-04 ENCOUNTER — Ambulatory Visit (INDEPENDENT_AMBULATORY_CARE_PROVIDER_SITE_OTHER): Payer: Medicaid Other | Admitting: Family

## 2022-09-04 ENCOUNTER — Encounter: Payer: Self-pay | Admitting: Family

## 2022-09-04 VITALS — BP 112/72 | HR 77 | Ht 62.0 in | Wt 171.4 lb

## 2022-09-04 DIAGNOSIS — N898 Other specified noninflammatory disorders of vagina: Secondary | ICD-10-CM | POA: Diagnosis not present

## 2022-09-04 DIAGNOSIS — L700 Acne vulgaris: Secondary | ICD-10-CM | POA: Diagnosis not present

## 2022-09-04 DIAGNOSIS — E282 Polycystic ovarian syndrome: Secondary | ICD-10-CM

## 2022-09-04 DIAGNOSIS — L680 Hirsutism: Secondary | ICD-10-CM

## 2022-09-04 DIAGNOSIS — E559 Vitamin D deficiency, unspecified: Secondary | ICD-10-CM

## 2022-09-04 NOTE — Progress Notes (Signed)
History was provided by the patient.  Rose Savage is a 22 y.o. female who is here for PCOS, hirsutism, acne.   PCP confirmed? Yes.    Jacelyn Grip, MD  Plan from last visit:  1. PCOS (polycystic ovarian syndrome) 2. Hirsutism 3. Acne vulgaris -symptoms managed well with COC and spironolactone use  -return in 3 months  -comorb labs at next visit  -assessed mood today; stable with no concerns at present    HPI:   -staying up late watching wrestling - favorites are [employer] and Baldo Ash Liliane [employer] Flare's daughter)  -fishy smell discharge- mostly white or blood tinge  -has chores at home, not working outside of the home yet  -going to bathroom a lot - peeing a lot; both night and day  -no pain or burning, no hesitancy, empties bladder  -drinking water with medicine; juice, soda, tea -  -water 2 full cups  -soda/tea - 1/2 cup  -more for juice  -sometimes skips meals  -feels hungry at night when she skips dinner  -snacks during day  -discussed referral for ongoing care due to age; she is agreeable to referral  -psych meds managed elsewhere; safe to self; feels better when on meds; tremor better; forgot meds this weekend when she went to her sister's but restarted them and feeling better now   Patient Active Problem List   Diagnosis Date Noted   Bipolar disorder, current episode mixed, mild ([place])    Tremor 01/27/2019   PCOS (polycystic ovarian syndrome) 08/28/2014   Abnormal weight gain 08/28/2014   Acne vulgaris 08/28/2014    Current Outpatient Medications on File Prior to Visit  Medication Sig Dispense Refill   amantadine (SYMMETREL) 100 MG capsule Take 100 mg by mouth 2 (two) times daily.     [facility] MELATONIN 3 MG TABS tablet TAKE 1 TABLET (3 MG TOTAL) BY MOUTH AT BEDTIME AS NEEDED. 90 tablet 3   divalproex (DEPAKOTE ER) 500 MG 24 hr tablet Take 500 mg by mouth 2 (two) times daily.  2   FANAPT 2 MG TABS Take by mouth at bedtime.     fluticasone (FLONASE) 50 MCG/ACT  nasal spray Place 1 spray into both nostrils daily. 16 g 12   levocetirizine (XYZAL) 5 MG tablet TAKE 1 TABLET BY MOUTH EVERY DAY IN THE EVENING 90 tablet 0   norgestimate-ethinyl estradiol (ORTHO-CYCLEN) 0.25-35 MG-MCG tablet TAKE 1 TABLET BY MOUTH EVERY DAY 84 tablet 3   spironolactone (ALDACTONE) 100 MG tablet TAKE 1 TABLET BY MOUTH EVERY DAY 90 tablet 1   STRATTERA 80 MG capsule Take 80 mg by mouth daily.  3   No current facility-administered medications on file prior to visit.    No Known Allergies  Physical Exam:    Vitals:   09/04/22 0914  BP: 112/72  Pulse: 77  Weight: 171 lb 6.4 oz (77.7 kg)  Height: 5\' 2"  (1.575 m)   Wt Readings from Last 3 Encounters:  09/04/22 171 lb 6.4 oz (77.7 kg)  04/13/22 162 lb 3.2 oz (73.6 kg)  12/22/21 169 lb 2 oz (76.7 kg)     Growth %ile SmartLinks can only be used for patients less than 68 years old. No LMP recorded. (Menstrual status: Oral contraceptives).  Physical Exam Constitutional:      General: She is not in acute distress.    Appearance: She is well-developed.  HENT:     Head: Normocephalic and atraumatic.  Eyes:     General: No scleral icterus.  Pupils: Pupils are equal, round, and reactive to light.  Neck:     Thyroid: No thyromegaly.  Cardiovascular:     Rate and Rhythm: Normal rate and regular rhythm.     Heart sounds: Normal heart sounds. No murmur heard. Pulmonary:     Effort: Pulmonary effort is normal.     Breath sounds: Normal breath sounds.  Musculoskeletal:        General: Normal range of motion.     Cervical back: Normal range of motion and neck supple.  Lymphadenopathy:     Cervical: No cervical adenopathy.  Skin:    General: Skin is warm and dry.     Findings: No rash.  Neurological:     Mental Status: She is alert and oriented to person, place, and time.     Cranial Nerves: No cranial nerve deficit.     Motor: No tremor.  Psychiatric:        Behavior: Behavior normal.        Thought Content:  Thought content normal.        Judgment: Judgment normal.      Assessment/Plan:  -likely BV, will screen today  -will assess A1C  -increase water intake, decrease sugary drinks  -sleep hygiene issues 2/2 to lack of structure in day  -referral to OB/GYN to establish care; continue with OCP and spironolactone  -PCOS comorb labs today   1. Vaginal discharge - WET PREP BY MOLECULAR PROBE  2. PCOS (polycystic ovarian syndrome) - CBC with Differential/Platelet - Comprehensive metabolic panel - Hemoglobin A1c - Lipid panel  3. Hirsutism 4. Acne vulgaris 5. Vitamin D deficiency - VITAMIN D 25 Hydroxy (Vit-D Deficiency, Fractures)

## 2022-09-04 NOTE — Patient Instructions (Signed)
Return in 3 months  I will reach out with lab results from today!   Keep taking birth control pills and the spironolactone.

## 2022-09-05 LAB — HEMOGLOBIN A1C
Hgb A1c MFr Bld: 5.2 % of total Hgb (ref <5.7)
Mean Plasma Glucose: 103 mg/dL
eAG (mmol/L): 5.7 mmol/L

## 2022-09-05 LAB — CULTURE, GROUP A STREP (THRC)

## 2022-09-05 LAB — COMPREHENSIVE METABOLIC PANEL
AG Ratio: 1.2 (calc) (ref 1.0–2.5)
ALT: 18 U/L (ref 6–29)
AST: 17 U/L (ref 10–30)
Albumin: 3.8 g/dL (ref 3.6–5.1)
Alkaline phosphatase (APISO): 48 U/L (ref 31–125)
BUN: 7 mg/dL (ref 7–25)
CO2: 23 mmol/L (ref 20–32)
Calcium: 8.9 mg/dL (ref 8.6–10.2)
Chloride: 102 mmol/L (ref 98–110)
Creat: 0.63 mg/dL (ref 0.50–0.96)
Globulin: 3.2 g/dL (calc) (ref 1.9–3.7)
Glucose, Bld: 75 mg/dL (ref 65–99)
Potassium: 3.9 mmol/L (ref 3.5–5.3)
Sodium: 136 mmol/L (ref 135–146)
Total Bilirubin: 0.2 mg/dL (ref 0.2–1.2)
Total Protein: 7 g/dL (ref 6.1–8.1)

## 2022-09-05 LAB — CBC WITH DIFFERENTIAL/PLATELET
Absolute Monocytes: 706 cells/uL (ref 200–950)
Basophils Absolute: 9 cells/uL (ref 0–200)
Basophils Relative: 0.1 %
Eosinophils Absolute: 17 cells/uL (ref 15–500)
Eosinophils Relative: 0.2 %
HCT: 38.5 % (ref 35.0–45.0)
Hemoglobin: 12.9 g/dL (ref 11.7–15.5)
Lymphs Abs: 1649 cells/uL (ref 850–3900)
MCH: 30.6 pg (ref 27.0–33.0)
MCHC: 33.5 g/dL (ref 32.0–36.0)
MCV: 91.2 fL (ref 80.0–100.0)
MPV: 11.6 fL (ref 7.5–12.5)
Monocytes Relative: 8.3 %
Neutro Abs: 6120 cells/uL (ref 1500–7800)
Neutrophils Relative %: 72 %
Platelets: 145 10*3/uL (ref 140–400)
RBC: 4.22 10*6/uL (ref 3.80–5.10)
RDW: 12.4 % (ref 11.0–15.0)
Total Lymphocyte: 19.4 %
WBC: 8.5 10*3/uL (ref 3.8–10.8)

## 2022-09-05 LAB — VITAMIN D 25 HYDROXY (VIT D DEFICIENCY, FRACTURES): Vit D, 25-Hydroxy: 33 ng/mL (ref 30–100)

## 2022-09-05 LAB — WET PREP BY MOLECULAR PROBE
Candida species: NOT DETECTED
Gardnerella vaginalis: NOT DETECTED
MICRO NUMBER:: 14743948
SPECIMEN QUALITY:: ADEQUATE
Trichomonas vaginosis: NOT DETECTED

## 2022-09-05 LAB — LIPID PANEL
Cholesterol: 116 mg/dL (ref <200)
HDL: 65 mg/dL (ref >=50)
LDL Cholesterol (Calc): 32 mg/dL (calc)
Non-HDL Cholesterol (Calc): 51 mg/dL (calc) (ref <130)
Total CHOL/HDL Ratio: 1.8 (calc) (ref <5.0)
Triglycerides: 102 mg/dL (ref <150)

## 2022-12-29 ENCOUNTER — Other Ambulatory Visit: Payer: Self-pay | Admitting: Family

## 2022-12-29 DIAGNOSIS — L700 Acne vulgaris: Secondary | ICD-10-CM

## 2023-01-19 ENCOUNTER — Other Ambulatory Visit: Payer: Self-pay | Admitting: Family

## 2023-01-19 DIAGNOSIS — L700 Acne vulgaris: Secondary | ICD-10-CM

## 2023-04-09 ENCOUNTER — Other Ambulatory Visit: Payer: Self-pay | Admitting: Family

## 2023-04-19 ENCOUNTER — Ambulatory Visit (INDEPENDENT_AMBULATORY_CARE_PROVIDER_SITE_OTHER): Payer: MEDICAID | Admitting: Student

## 2023-04-19 ENCOUNTER — Encounter: Payer: Self-pay | Admitting: Student

## 2023-04-19 VITALS — BP 118/64 | HR 76 | Ht 62.0 in | Wt 170.8 lb

## 2023-04-19 DIAGNOSIS — R350 Frequency of micturition: Secondary | ICD-10-CM | POA: Diagnosis not present

## 2023-04-19 LAB — POCT URINALYSIS DIP (MANUAL ENTRY)
Bilirubin, UA: NEGATIVE
Blood, UA: NEGATIVE
Glucose, UA: NEGATIVE mg/dL
Ketones, POC UA: NEGATIVE mg/dL
Leukocytes, UA: NEGATIVE
Nitrite, UA: NEGATIVE
Protein Ur, POC: NEGATIVE mg/dL
Spec Grav, UA: 1.01 (ref 1.010–1.025)
Urobilinogen, UA: 0.2 U/dL
pH, UA: 7 (ref 5.0–8.0)

## 2023-04-19 LAB — POCT UA - MICROSCOPIC ONLY
RBC, Urine, Miroscopic: NONE SEEN (ref 0–2)
WBC, Ur, HPF, POC: NONE SEEN (ref 0–5)

## 2023-04-19 LAB — POCT GLYCOSYLATED HEMOGLOBIN (HGB A1C): Hemoglobin A1C: 5 % (ref 4.0–5.6)

## 2023-04-19 NOTE — Patient Instructions (Addendum)
It was great to see you! Thank you for allowing me to participate in your care!  Our plans for today:  - Increased Urinary Frequency (Peeing too often) We are checking some lab work, to see if there is something causing you to urinate often.   If the lab work is completely normal, you may be drinking too much water, and need to cut back to see if this plays a role. Drink half as much water and only drink water when you are thirsty  This also may be coming from your Spironolactone. You may want to hold this medicine for 4-5 days to see if this is playing a role.  - Pap Smear You are due for your first Pap Smear. This is to screen for cervical cancer. Please make an appointment to come in for this.   We are checking some labs today, I will call you if they are abnormal will send you a MyChart message or a letter if they are normal.  If you do not hear about your labs in the next 2 weeks please let us know.  Take care and seek immediate care sooner if you develop any concerns.   Dr. Bess Kinds, MD [facility]

## 2023-04-19 NOTE — Assessment & Plan Note (Addendum)
Patient comes in with complaint of increased urinary frequency for the last month.  Patient appreciates she drinks 3-1/2 large travel sized bottles of water, twice a day.  Patient appreciates she urinates mostly in the mornings when she wakes up, in the evenings before bed, and in the afternoons after lunch.  Patient has numerous reasons for possible increased urinary frequency, will test for UTI, diabetes, electrolyte abnormality. Patient also on medication for bipolar disorder, Depakote, taking spironolactone for PCOS, and also drinking large volume of water daily.  If labs normal will recommend cut daily water intake by half, or consider discontinuing spironolactone. - UA - A1c - BMP - Consider decreased water by half - Consider D/c Spyro

## 2023-04-19 NOTE — Progress Notes (Signed)
  SUBJECTIVE:   CHIEF COMPLAINT / HPI:   Frequent Urination For the past month she is having frequent urination. Even if not drinking fluids.  No pain with urination. Has no urinary hesitancy. Drinks a lot of water during the day/evening. Mostly drinking water, and has to pee in the morning when she wakes, in between lunch, and after dinner. Drinks 3.5 large travel sized water containers of water BID.   PAP? Due for Pap  PERTINENT  PMH / PSH:   Past Medical History:  Diagnosis Date   Bipolar disorder ([facility])    PCOS (polycystic ovarian syndrome)    OBJECTIVE:  There were no vitals taken for this visit. Physical Exam Constitutional:      General: She is not in acute distress.    Appearance: Normal appearance. She is not ill-appearing.  Abdominal:     General: There is no distension.     Palpations: Abdomen is soft. There is no mass.     Tenderness: There is no abdominal tenderness. There is no right CVA tenderness or left CVA tenderness.     Hernia: No hernia is present.  Neurological:     Mental Status: She is alert.  Psychiatric:        Mood and Affect: Mood normal.        Behavior: Behavior normal.      ASSESSMENT/PLAN:   Assessment & Plan Urinary frequency Patient comes in with complaint of increased urinary frequency for the last month.  Patient appreciates she drinks 3-1/2 large travel sized bottles of water, twice a day.  Patient appreciates she urinates mostly in the mornings when she wakes up, in the evenings before bed, and in the afternoons after lunch.  Patient has numerous reasons for possible increased urinary frequency, will test for UTI, diabetes, electrolyte abnormality. Patient also on medication for bipolar disorder, Depakote, taking spironolactone for PCOS, and also drinking large volume of water daily.  If labs normal will recommend cut daily water intake by half, or consider discontinuing spironolactone. - UA - A1c - BMP - Consider decreased water by  half - Consider D/c Spyro No follow-ups on file. Bess Kinds, MD 04/19/2023, 7:58 AM PGY-3, [facility]

## 2023-04-20 LAB — BASIC METABOLIC PANEL
BUN/Creatinine Ratio: 5 — ABNORMAL LOW (ref 9–23)
BUN: 3 mg/dL — ABNORMAL LOW (ref 6–20)
CO2: 24 mmol/L (ref 20–29)
Calcium: 9.5 mg/dL (ref 8.7–10.2)
Chloride: 97 mmol/L (ref 96–106)
Creatinine, Ser: 0.63 mg/dL (ref 0.57–1.00)
Glucose: 96 mg/dL (ref 70–99)
Potassium: 4 mmol/L (ref 3.5–5.2)
Sodium: 133 mmol/L — ABNORMAL LOW (ref 134–144)
eGFR: 129 mL/min/{1.73_m2} (ref >59)

## 2023-04-25 ENCOUNTER — Encounter: Payer: Self-pay | Admitting: Student

## 2023-05-16 ENCOUNTER — Ambulatory Visit: Payer: MEDICAID | Admitting: Family Medicine

## 2023-07-15 ENCOUNTER — Other Ambulatory Visit: Payer: Self-pay | Admitting: Family

## 2023-07-15 DIAGNOSIS — L700 Acne vulgaris: Secondary | ICD-10-CM

## 2023-07-21 ENCOUNTER — Other Ambulatory Visit: Payer: Self-pay | Admitting: Family

## 2023-07-21 DIAGNOSIS — E282 Polycystic ovarian syndrome: Secondary | ICD-10-CM

## 2023-07-22 ENCOUNTER — Other Ambulatory Visit: Payer: Self-pay | Admitting: Family

## 2023-07-22 DIAGNOSIS — L700 Acne vulgaris: Secondary | ICD-10-CM

## 2023-08-25 ENCOUNTER — Other Ambulatory Visit: Payer: Self-pay | Admitting: Family

## 2023-08-25 DIAGNOSIS — L700 Acne vulgaris: Secondary | ICD-10-CM

## 2023-08-25 DIAGNOSIS — E282 Polycystic ovarian syndrome: Secondary | ICD-10-CM

## 2023-12-05 ENCOUNTER — Ambulatory Visit: Payer: MEDICAID | Admitting: Family Medicine

## 2024-01-23 ENCOUNTER — Ambulatory Visit (INDEPENDENT_AMBULATORY_CARE_PROVIDER_SITE_OTHER): Admitting: Family Medicine

## 2024-01-23 VITALS — BP 115/75 | HR 85 | Ht 62.0 in | Wt 172.6 lb

## 2024-01-23 DIAGNOSIS — L700 Acne vulgaris: Secondary | ICD-10-CM

## 2024-01-23 DIAGNOSIS — Z0000 Encounter for general adult medical examination without abnormal findings: Secondary | ICD-10-CM

## 2024-01-23 DIAGNOSIS — R251 Tremor, unspecified: Secondary | ICD-10-CM | POA: Diagnosis present

## 2024-01-23 NOTE — Patient Instructions (Addendum)
 I have sent in a referral to neurology to further assess your tremor.  For your acne, I recommend using a daily salicylic acid or benzoyl peroxide  cleanser with Adapalene  gel.

## 2024-01-23 NOTE — Assessment & Plan Note (Signed)
 Mild-moderate and recurrent. Prefer to hold off on spironolactone  again for now until we try skin cleansing. Recommended salicylic acid or benzoyl peroxide  cleansers daily along with use of Adapalene  gel. She will return if not improving.

## 2024-01-23 NOTE — Assessment & Plan Note (Signed)
 Most likely secondary to antipsychotic use for bipolar 1 disorder. TSH and BMP normal when symptoms first occurred. Rarer causes such as Wilson disease less likely though do remain on differential. Will refer to neurology at this time for further assessment and workup.

## 2024-01-23 NOTE — Progress Notes (Addendum)
    SUBJECTIVE:   CHIEF COMPLAINT / HPI:   Hands shaking Previously had noted tremor in right thumb in 2021, felt to be due to medications for bipolar disorder like depakote. TSH and BMP at that time reassuring. Worsening again since last 2 months. Worse with drinking water. Switch amantadine to some other medication from psychiatrist to an unknown medication, though this has not helped the shaking.  Acne Does not use cleansers. Used to be on spironolactone , but she is no longer taking this.  PERTINENT  PMH / PSH: PCOS, bipolar disorder, tremor  OBJECTIVE:   BP 115/75   Pulse 85   Ht 5' 2 (1.575 m)   Wt 172 lb 9.6 oz (78.3 kg)   SpO2 100%   BMI 31.57 kg/m   General: Alert and oriented, in NAD Skin: Warm, dry, and intact; multiple comedones over the face HEENT: NCAT, EOM grossly normal, midline nasal septum Respiratory: Breathing and speaking comfortably on RA Extremities: Moves all extremities grossly equally Neurological: Low frequency tremor noted in bilateral hands with intention; however, when examining both hands while holding a phone in one of them, the hand without the phone would tremor at rest while the hand holding the phone would remain still; strength and sensation in bilateral upper extremities normal Psychiatric: Appropriate mood and affect   ASSESSMENT/PLAN:   Assessment & Plan Tremor Most likely secondary to antipsychotic use for bipolar 1 disorder. TSH and BMP normal when symptoms first occurred. Rarer causes such as Wilson disease less likely though do remain on differential. Will refer to neurology at this time for further assessment and workup. Acne vulgaris Mild-moderate and recurrent. Prefer to hold off on spironolactone  again for now until we try skin cleansing. Recommended salicylic acid or benzoyl peroxide  cleansers daily along with use of Adapalene  gel. She will return if not improving. Healthcare maintenance She will return in 1 month for care  gaps.   Stuart Redo, MD [facility]

## 2024-01-31 ENCOUNTER — Telehealth: Payer: Self-pay

## 2024-01-31 NOTE — Telephone Encounter (Signed)
 Attempted to call patient per Dr. Albin request to inform her that the neurology referral was denied and she will need to call to speak to her psychiatry first about medication change to access if her tremor will improve.  LVM for patient to call office back.  If patient calls back please relay the message above from Dr. Tharon.  Harlene Reiter, CMA

## 2024-02-14 ENCOUNTER — Ambulatory Visit: Admitting: Family Medicine

## 2024-02-21 ENCOUNTER — Telehealth: Payer: Self-pay | Admitting: Family Medicine

## 2024-02-21 ENCOUNTER — Encounter: Payer: Self-pay | Admitting: Family Medicine

## 2024-02-21 ENCOUNTER — Ambulatory Visit (INDEPENDENT_AMBULATORY_CARE_PROVIDER_SITE_OTHER): Admitting: Family Medicine

## 2024-02-21 VITALS — BP 144/132 | HR 86 | Ht 62.0 in | Wt 173.8 lb

## 2024-02-21 DIAGNOSIS — R079 Chest pain, unspecified: Secondary | ICD-10-CM | POA: Diagnosis present

## 2024-02-21 DIAGNOSIS — R251 Tremor, unspecified: Secondary | ICD-10-CM | POA: Diagnosis not present

## 2024-02-21 NOTE — Assessment & Plan Note (Signed)
 Appears stable. Recommend she discuss medications with Psychiatry. She is in agreement.

## 2024-02-21 NOTE — Progress Notes (Signed)
    SUBJECTIVE:   CHIEF COMPLAINT / HPI:   Chest pain Developed recently, in the last couple of weeks. Comes and goes.  At night it gives me problems - woke me up last night. Feels like stabbing or getting punched in the chest. Lasts for a couple of minutes and then goes away. If she repositions and lies on her back or her side sometimes that helps. When chest pain occurs she feels like she cannot breathe; denies N/V, dizziness, feeling sweaty.  Tremors Hands are still shaking, but not worsening. No other symptoms or changes. She was seen for this 8/14 and referred to Neurology who recommended discussion with her Psychiatrist about medications she is on which may be contributing. She has not been able to speak with her Psychiatrist yet.  PERTINENT  PMH / PSH: Reviewed.  OBJECTIVE:   BP (!) 144/132   Pulse 86   Ht 5' 2 (1.575 m)   Wt 173 lb 12.8 oz (78.8 kg)   SpO2 99%   BMI 31.79 kg/m   General: well-appearing, no acute distress. HEENT: normocephalic, PERRLA, EOM grossly intact, MMM. Cardio: Regular rate, regular rhythm, no murmurs on exam. Pulm: Clear, no wheezing, no crackles. No increased work of breathing. Abdominal: bowel sounds present, soft, non-tender, non-distended. No HSM. Extremities: no peripheral edema. Moves all extremities equally. Neuro: Alert and oriented x3, speech normal in content. Psych:  Cognition and judgment appear intact. Alert, communicative, and cooperative.   ASSESSMENT/PLAN:   Assessment & Plan Chest pain, unspecified type EKG obtained in clinic is reassuring. Given that symptoms typically occur at night when lying down I suspect GERD could be contributing. Patient is not sure if she wants to try rx medication for this at this time. - recommended trial of Prilosec OTC - return precautions given Tremor Appears stable. Recommend she discuss medications with Psychiatry. She is in agreement.   Lauraine Norse, DO [facility]

## 2024-02-21 NOTE — Patient Instructions (Addendum)
 It was so good to see you today! Thank you for allowing me to take care of you.  Today we discussed the following concerns and plans:  Chest pain - we did an EKG which looked normal. This is great news! - This discomfort could be related to acid reflux - I would recommend trying an over the counter medicine called Prilosec that you can buy at your pharmacy.  If you have any concerns, please call the clinic or schedule an appointment.  It was a pleasure to take care of you today. Be well!  Lauraine Norse, DO [facility], PGY-2  Do you need your medications delivered to your home?   We'll send your prescription to the [facility] for delivery.          Address: [address] [place], [place], [place] [address]          Phone: [phone]  Please call the [facility] to speak with a pharmacist and set up your home medication delivery. If you have any questions, feel free to contact us  -- we're happy to help!  Other [facility] that offer affordable prices on both prescriptions and over-the-counter items, as well as convenient services like vaccinations, are  [facility], at [facility]         Address:  [address], [place], [place] [address]         Phone: [phone]  [facility], located in [facility]        Address: [address], [place], [place] [address]        Phone: [phone]  [facility], at [facility]       Address: [address], [place], [place] [address]       Phone: [phone]  [facility], at [facility]       Address: [address], [place], [place] [address]       Phone: [phone]

## 2024-02-21 NOTE — Telephone Encounter (Signed)
 Attempted to call patient x2 and left voicemail message regarding blood pressure.  BP was elevated in clinic today - though I do believe this was erroneous, it would be best to recheck her BP. Nurse visit for recheck is appropriate.

## 2024-02-26 ENCOUNTER — Other Ambulatory Visit: Payer: Self-pay | Admitting: Family

## 2024-02-26 DIAGNOSIS — E282 Polycystic ovarian syndrome: Secondary | ICD-10-CM

## 2024-02-26 DIAGNOSIS — L700 Acne vulgaris: Secondary | ICD-10-CM
